# Patient Record
Sex: Female | Born: 1960 | Race: Black or African American | Hispanic: No | Marital: Married | Smoking: Never smoker
Health system: Southern US, Community
[De-identification: ages and names within clinical notes are randomized; demographics above are authoritative.]

## PROBLEM LIST (undated history)

## (undated) DIAGNOSIS — E119 Type 2 diabetes mellitus without complications: Secondary | ICD-10-CM

## (undated) DIAGNOSIS — I10 Essential (primary) hypertension: Secondary | ICD-10-CM

## (undated) DIAGNOSIS — D649 Anemia, unspecified: Secondary | ICD-10-CM

## (undated) DIAGNOSIS — E785 Hyperlipidemia, unspecified: Secondary | ICD-10-CM

## (undated) HISTORY — DX: Essential (primary) hypertension: I10

## (undated) HISTORY — DX: Type 2 diabetes mellitus without complications: E11.9

## (undated) HISTORY — DX: Anemia, unspecified: D64.9

## (undated) HISTORY — DX: Hyperlipidemia, unspecified: E78.5

## (undated) HISTORY — PX: APPENDECTOMY: SHX54

---

## 1998-11-07 ENCOUNTER — Other Ambulatory Visit: Admission: RE | Admit: 1998-11-07 | Discharge: 1998-11-07 | Payer: Self-pay | Admitting: Gynecology

## 2000-09-22 ENCOUNTER — Other Ambulatory Visit: Admission: RE | Admit: 2000-09-22 | Discharge: 2000-09-22 | Payer: Self-pay | Admitting: Gynecology

## 2001-08-11 ENCOUNTER — Other Ambulatory Visit: Admission: RE | Admit: 2001-08-11 | Discharge: 2001-08-11 | Payer: Self-pay | Admitting: Gynecology

## 2002-08-20 ENCOUNTER — Other Ambulatory Visit: Admission: RE | Admit: 2002-08-20 | Discharge: 2002-08-20 | Payer: Self-pay | Admitting: Gynecology

## 2003-10-04 ENCOUNTER — Other Ambulatory Visit: Admission: RE | Admit: 2003-10-04 | Discharge: 2003-10-04 | Payer: Self-pay | Admitting: Gynecology

## 2004-10-05 ENCOUNTER — Other Ambulatory Visit: Admission: RE | Admit: 2004-10-05 | Discharge: 2004-10-05 | Payer: Self-pay | Admitting: Gynecology

## 2005-10-14 ENCOUNTER — Other Ambulatory Visit: Admission: RE | Admit: 2005-10-14 | Discharge: 2005-10-14 | Payer: Self-pay | Admitting: Gynecology

## 2006-10-25 ENCOUNTER — Other Ambulatory Visit: Admission: RE | Admit: 2006-10-25 | Discharge: 2006-10-25 | Payer: Self-pay | Admitting: Gynecology

## 2007-11-09 ENCOUNTER — Other Ambulatory Visit: Admission: RE | Admit: 2007-11-09 | Discharge: 2007-11-09 | Payer: Self-pay | Admitting: Gynecology

## 2009-01-03 ENCOUNTER — Other Ambulatory Visit: Admission: RE | Admit: 2009-01-03 | Discharge: 2009-01-03 | Payer: Self-pay | Admitting: Gynecology

## 2009-01-03 ENCOUNTER — Ambulatory Visit: Payer: Self-pay | Admitting: Gynecology

## 2009-01-03 ENCOUNTER — Encounter: Payer: Self-pay | Admitting: Gynecology

## 2010-01-26 ENCOUNTER — Other Ambulatory Visit: Admission: RE | Admit: 2010-01-26 | Discharge: 2010-01-26 | Payer: Self-pay | Admitting: Gynecology

## 2010-01-26 ENCOUNTER — Ambulatory Visit: Payer: Self-pay | Admitting: Gynecology

## 2010-08-28 ENCOUNTER — Ambulatory Visit: Payer: Self-pay | Admitting: Gynecology

## 2011-01-29 ENCOUNTER — Encounter: Payer: Self-pay | Admitting: Gynecology

## 2011-01-29 ENCOUNTER — Ambulatory Visit (INDEPENDENT_AMBULATORY_CARE_PROVIDER_SITE_OTHER): Payer: PRIVATE HEALTH INSURANCE | Admitting: Gynecology

## 2011-01-29 ENCOUNTER — Other Ambulatory Visit (HOSPITAL_COMMUNITY)
Admission: RE | Admit: 2011-01-29 | Discharge: 2011-01-29 | Disposition: A | Discharge status: Home / Self Care | Payer: PRIVATE HEALTH INSURANCE | Source: Ambulatory Visit | Attending: Gynecology | Admitting: Gynecology

## 2011-01-29 ENCOUNTER — Encounter: Payer: Self-pay | Admitting: Gastroenterology

## 2011-01-29 VITALS — BP 140/80 | Ht 62.0 in | Wt 194.0 lb

## 2011-01-29 DIAGNOSIS — D259 Leiomyoma of uterus, unspecified: Secondary | ICD-10-CM

## 2011-01-29 DIAGNOSIS — N390 Urinary tract infection, site not specified: Secondary | ICD-10-CM

## 2011-01-29 DIAGNOSIS — Z131 Encounter for screening for diabetes mellitus: Secondary | ICD-10-CM

## 2011-01-29 DIAGNOSIS — Z01419 Encounter for gynecological examination (general) (routine) without abnormal findings: Secondary | ICD-10-CM

## 2011-01-29 DIAGNOSIS — Z13220 Encounter for screening for lipoid disorders: Secondary | ICD-10-CM

## 2011-01-29 DIAGNOSIS — E119 Type 2 diabetes mellitus without complications: Secondary | ICD-10-CM

## 2011-01-29 DIAGNOSIS — R82998 Other abnormal findings in urine: Secondary | ICD-10-CM

## 2011-01-29 NOTE — Progress Notes (Signed)
Jocelyn Cole [birth year] [id number]        50 y.o.  for annual exam.  Is doing well having regular menses.  History of significant leiomyoma for which she is asymptomatic. Using condoms for contraception.  Past medical history,surgical history, medications, allergies, family history and social history were all reviewed and documented in the EPIC chart. ROS:  Was performed and pertinent positives and negatives are included in the history.  Exam: chaperone present Filed Vitals:   01/29/11 1026  BP: 140/80   General appearance  Normal Skin grossly normal Head/Neck normal with no cervical or supraclavicular adenopathy thyroid normal Lungs  clear 1. Cardiac RR, without RMG Abdominal  soft, nontender, without masses, organomegaly or hernia Breasts  examined lying and sitting without masses, retractions, discharge or axillary adenopathy. Pelvic  Ext/BUS/vagina  normal   Cervix  normal  Pap done  Uterus  16 weeks size irregular consistent with leiomyoma, midline, nontender   Adnexa  Without masses or tenderness  Somewhat limited exam to to bulk of uterus  Anus and perineum  normal   Rectovaginal  normal sphincter tone without palpated masses or tenderness.    Assessment/Plan:  50 y.o. female for annual exam.     1. Leiomyoma. Uterus is bulky as it has been on prior exams. She again denies symptoms such as pressure or menorrhagia. Recommend repeat ultrasound now just for baseline rule out adnexal disease. She has not had an ultrasound in several years. She will schedule this and followup with me afterwards. The issues of surgery for uterine size alone again was discussed with her and she is not interested in pursuing surgery at this point assuming that the ultrasound is reassuring. 2. Health maintenance. Self breast exams on a month basis discussed at first. She is due for mammography in November knows to schedule this. She has a family history of her father with colon cancer and I  recommended screening colonoscopy and she stated that in fact she is in the process of arranging this. We'll check baseline labs of CBC urinalysis glucose lipid profilemeds she will follow up for her ultrasound and we'll go from there.  Dara Lords MD, 11:59 AM 01/29/2011   a

## 2011-02-01 ENCOUNTER — Other Ambulatory Visit: Payer: Self-pay | Admitting: Gynecology

## 2011-02-01 MED ORDER — SULFAMETHOXAZOLE-TRIMETHOPRIM 800-160 MG PO TABS: 1.0000 | ORAL_TABLET | Freq: Two times a day (BID) | ORAL | Status: AC

## 2011-02-12 ENCOUNTER — Other Ambulatory Visit: Payer: PRIVATE HEALTH INSURANCE

## 2011-02-12 ENCOUNTER — Ambulatory Visit: Payer: PRIVATE HEALTH INSURANCE | Admitting: Gynecology

## 2011-02-16 ENCOUNTER — Ambulatory Visit (AMBULATORY_SURGERY_CENTER): Payer: PRIVATE HEALTH INSURANCE | Admitting: *Deleted

## 2011-02-16 ENCOUNTER — Encounter: Payer: Self-pay | Admitting: Gastroenterology

## 2011-02-16 DIAGNOSIS — Z800 Family history of malignant neoplasm of digestive organs: Secondary | ICD-10-CM

## 2011-02-16 DIAGNOSIS — Z1211 Encounter for screening for malignant neoplasm of colon: Secondary | ICD-10-CM

## 2011-02-16 MED ORDER — PEG-KCL-NACL-NASULF-NA ASC-C 100 G PO SOLR: 1.0000 | Freq: Once | ORAL | Status: DC

## 2011-02-16 NOTE — Progress Notes (Signed)
With prep instructions, pt told to hold Iron PO 5 days before procedure (02-25-11)

## 2011-02-26 ENCOUNTER — Encounter: Payer: Self-pay | Admitting: Gynecology

## 2011-02-26 ENCOUNTER — Other Ambulatory Visit: Payer: Self-pay | Admitting: Gynecology

## 2011-02-26 ENCOUNTER — Ambulatory Visit (INDEPENDENT_AMBULATORY_CARE_PROVIDER_SITE_OTHER): Payer: PRIVATE HEALTH INSURANCE | Admitting: Gynecology

## 2011-02-26 ENCOUNTER — Ambulatory Visit (INDEPENDENT_AMBULATORY_CARE_PROVIDER_SITE_OTHER): Payer: PRIVATE HEALTH INSURANCE

## 2011-02-26 VITALS — BP 130/88

## 2011-02-26 DIAGNOSIS — D259 Leiomyoma of uterus, unspecified: Secondary | ICD-10-CM

## 2011-02-26 NOTE — Progress Notes (Signed)
Patient also for ultrasound and follow up of her leiomyoma. Ultrasound shows multiple myomas right and left ovaries visualized with physiologic changes.  Reviewed this with the patient she is asymptomatic does not want intervention and wants to continue to monitor her myomas. Patient will follow up with me which is due for her next annual see her as needed

## 2011-03-02 ENCOUNTER — Ambulatory Visit (AMBULATORY_SURGERY_CENTER): Payer: PRIVATE HEALTH INSURANCE | Admitting: Gastroenterology

## 2011-03-02 ENCOUNTER — Encounter: Payer: Self-pay | Admitting: Gastroenterology

## 2011-03-02 DIAGNOSIS — Z800 Family history of malignant neoplasm of digestive organs: Secondary | ICD-10-CM

## 2011-03-02 DIAGNOSIS — Z1211 Encounter for screening for malignant neoplasm of colon: Secondary | ICD-10-CM

## 2011-03-02 HISTORY — PX: COLONOSCOPY: SHX174

## 2011-03-02 MED ORDER — SODIUM CHLORIDE 0.9 % IV SOLN: 500.0000 mL | INTRAVENOUS | Status: DC

## 2011-03-02 NOTE — Patient Instructions (Signed)
Read the handouts given to you by your recovery room nurse.   You need to increase your fiber in your diet due to diverticulosis and hemorrhoids. You may resume all of your routine medications today.    If you have any questions, please call [phone].  Thank-you for choosing [place] for your healthcare today.

## 2011-03-03 ENCOUNTER — Telehealth: Payer: Self-pay

## 2011-03-03 NOTE — Telephone Encounter (Signed)

## 2011-03-10 ENCOUNTER — Encounter: Payer: Self-pay | Admitting: Gynecology

## 2011-03-22 ENCOUNTER — Encounter: Payer: Self-pay | Admitting: Gynecology

## 2012-02-04 ENCOUNTER — Encounter: Payer: PRIVATE HEALTH INSURANCE | Admitting: Gynecology

## 2012-02-11 ENCOUNTER — Encounter: Payer: PRIVATE HEALTH INSURANCE | Admitting: Gynecology

## 2012-02-25 ENCOUNTER — Encounter: Payer: PRIVATE HEALTH INSURANCE | Admitting: Gynecology

## 2012-03-10 ENCOUNTER — Ambulatory Visit (INDEPENDENT_AMBULATORY_CARE_PROVIDER_SITE_OTHER): Payer: PRIVATE HEALTH INSURANCE | Admitting: Gynecology

## 2012-03-10 ENCOUNTER — Encounter: Payer: Self-pay | Admitting: Gynecology

## 2012-03-10 VITALS — BP 134/84 | Ht 61.0 in | Wt 196.0 lb

## 2012-03-10 DIAGNOSIS — Z01419 Encounter for gynecological examination (general) (routine) without abnormal findings: Secondary | ICD-10-CM

## 2012-03-10 DIAGNOSIS — D259 Leiomyoma of uterus, unspecified: Secondary | ICD-10-CM

## 2012-03-10 DIAGNOSIS — Z131 Encounter for screening for diabetes mellitus: Secondary | ICD-10-CM

## 2012-03-10 NOTE — Patient Instructions (Signed)
Follow up in one year for annual exam. Follow up sooner if periods become irregular or atypical or if you start to develop lower abdominal symptoms secondary to the fibroid tumors.

## 2012-03-10 NOTE — Progress Notes (Signed)
Jocelyn Cole Jun 15, 1960 [id number]        51 y.o.  G1P0101 for annual exam.    Past medical history,surgical history, medications, allergies, family history and social history were all reviewed and documented in the EPIC chart. ROS:  Was performed and pertinent positives and negatives are included in the history.  Exam: Kim assistant Filed Vitals:   03/10/12 1453  BP: 134/84  Height: 5\' 1"  (1.549 m)  Weight: 196 lb (88.905 kg)   General appearance  Normal Skin grossly normal Head/Neck normal with no cervical or supraclavicular adenopathy thyroid normal Lungs  clear Cardiac RR, without RMG Abdominal  soft, nontender, without masses, organomegaly or hernia Breasts  examined lying and sitting without masses, retractions, discharge or axillary adenopathy. Pelvic  Ext/BUS/vagina  normal   Cervix  normal   Uterus  Bulky 16 weeks size, consistent with leiomyoma. Nontender   Adnexa  Difficult to assess due to uterus    Anus and perineum  normal   Rectovaginal  normal sphincter tone without palpated masses or tenderness.    Assessment/Plan:  51 y.o. G37P0101 female for annual exam.   1. Leiomyoma.  16 weeks size. Consistent with past exam. Again reviewed the issues with her. She is asymptomatic as far as pressure/pain/urinary/bowel movement issues. Menses are regular not overly heavy. The issue about not being able to evaluate the adnexa and ovarian cancer screening discussed. Patient is comfortable with continued observation. Ultrasound last year we'll hold this year as exam is unchanged. 2. Age 51. Continues with regular menses. No hot flushes night sweats or other menopausal symptoms. We'll continue to monitor. If less frequent but regular menses we'll follow period of prolonged or atypical bleeding she knows to represent for evaluation. 3. Blood pressure 134/84. Reviewed with patient. She'll have it rechecked in a non- exam situation.  It remains elevated the need to see a primary  discussed. 4. Mammography. Patient has scheduled end of this month. Continue with annual mammography. SBE monthly reviewed. 5. Pap smear. No Pap smear done today.  Pap smear 01/2011 normal.  No history of abnormal Pap smears. Plan every 3-5 year screening. 6. Colonoscopy 2012 normal. Plan repeat in 5 years given father's history. 7. Health maintenance.  CBC glucose and urinalysis ordered.  Lipid profile normal 2012. Follow up one year, sooner as needed.    Dara Lords MD, 4:10 PM 03/10/2012

## 2012-03-11 LAB — CBC WITH DIFFERENTIAL/PLATELET
Basophils Absolute: 0 10*3/uL (ref 0.0–0.1)
HCT: 36.8 % (ref 36.0–46.0)
Hemoglobin: 12 g/dL (ref 12.0–15.0)
Lymphocytes Relative: 32 % (ref 12–46)
Lymphs Abs: 2 10*3/uL (ref 0.7–4.0)
Monocytes Absolute: 0.5 10*3/uL (ref 0.1–1.0)
Monocytes Relative: 8 % (ref 3–12)
Neutro Abs: 3.5 10*3/uL (ref 1.7–7.7)
RBC: 4.94 MIL/uL (ref 3.87–5.11)
WBC: 6.1 10*3/uL (ref 4.0–10.5)

## 2012-03-11 LAB — URINALYSIS W MICROSCOPIC + REFLEX CULTURE
Glucose, UA: NEGATIVE mg/dL
Leukocytes, UA: NEGATIVE
Protein, ur: NEGATIVE mg/dL
pH: 6 (ref 5.0–8.0)

## 2012-03-24 ENCOUNTER — Encounter: Payer: Self-pay | Admitting: Gynecology

## 2012-07-02 ENCOUNTER — Ambulatory Visit: Payer: No Typology Code available for payment source

## 2012-07-02 ENCOUNTER — Ambulatory Visit (INDEPENDENT_AMBULATORY_CARE_PROVIDER_SITE_OTHER): Payer: No Typology Code available for payment source | Admitting: Emergency Medicine

## 2012-07-02 VITALS — BP 147/85 | HR 72 | Temp 98.2°F | Resp 16 | Ht 61.5 in | Wt 202.6 lb

## 2012-07-02 DIAGNOSIS — R05 Cough: Secondary | ICD-10-CM

## 2012-07-02 DIAGNOSIS — K219 Gastro-esophageal reflux disease without esophagitis: Secondary | ICD-10-CM

## 2012-07-02 DIAGNOSIS — R059 Cough, unspecified: Secondary | ICD-10-CM

## 2012-07-02 MED ORDER — RANITIDINE HCL 150 MG PO TABS: 150.0000 mg | ORAL_TABLET | Freq: Two times a day (BID) | ORAL | Status: DC

## 2012-07-02 MED ORDER — BENZONATATE 100 MG PO CAPS: 100.0000 mg | ORAL_CAPSULE | Freq: Three times a day (TID) | ORAL | Status: DC | PRN

## 2012-07-02 MED ORDER — HYDROCODONE-HOMATROPINE 5-1.5 MG/5ML PO SYRP: 5.0000 mL | ORAL_SOLUTION | Freq: Three times a day (TID) | ORAL | Status: DC | PRN

## 2012-07-02 NOTE — Progress Notes (Signed)
  Subjective:    Patient ID: Jocelyn Cole, female    DOB: 1961-01-20, 52 y.o.   MRN: [id number]  HPI  Patient comes in today stating that she had the flu a few weeks ago and can not get rid of the cough. She has a heavy feeling in her chest. She was using Robitussin and it would relieve the cough. The cough is not productive, and is occasional. She states that it is worse in the morning with occasional difficulties breathing. She started with the flu symptoms seven weeks ago. She vomited last Sunday and thought it was returning. She denies any further GI upset.   She takes an iron pill every day for anemia. She denies chest pain, any working out actually helps her breath better. She is currently on her menses and no chance of pregnancy.     Review of Systems     Objective:   Physical Exam  TM Normal. Throat is normal. Few rhonchi left upper lobe.   [facility] reading (PRIMARY) by  Dr.Daub NAD      Assessment & Plan:   1. Hycodan to take at bedtime. 2. Tesslon Pearls to take during the day. 3. Acid Reflux medication Zantac 150 BID.   Follow up if symptoms worsen.

## 2012-07-02 NOTE — Patient Instructions (Addendum)
Cough, Adult  A cough is a reflex that helps clear your throat and airways. It can help heal the body or may be a reaction to an irritated airway. A cough may only last 2 or 3 weeks (acute) or may last more than 8 weeks (chronic).  CAUSES Acute cough:  Viral or bacterial infections. Chronic cough:  Infections.  Allergies.  Asthma.  Post-nasal drip.  Smoking.  Heartburn or acid reflux.  Some medicines.  Chronic lung problems (COPD).  Cancer. SYMPTOMS   Cough.  Fever.  Chest pain.  Increased breathing rate.  High-pitched whistling sound when breathing (wheezing).  Colored mucus that you cough up (sputum). TREATMENT   A bacterial cough may be treated with antibiotic medicine.  A viral cough must run its course and will not respond to antibiotics.  Your caregiver may recommend other treatments if you have a chronic cough. HOME CARE INSTRUCTIONS   Only take over-the-counter or prescription medicines for pain, discomfort, or fever as directed by your caregiver. Use cough suppressants only as directed by your caregiver.  Use a cold steam vaporizer or humidifier in your bedroom or home to help loosen secretions.  Sleep in a semi-upright position if your cough is worse at night.  Rest as needed.  Stop smoking if you smoke. SEEK IMMEDIATE MEDICAL CARE IF:   You have pus in your sputum.  Your cough starts to worsen.  You cannot control your cough with suppressants and are losing sleep.  You begin coughing up blood.  You have difficulty breathing.  You develop pain which is getting worse or is uncontrolled with medicine.  You have a fever. MAKE SURE YOU:   Understand these instructions.  Will watch your condition.  Will get help right away if you are not doing well or get worse. Document Released: 10/16/2010 Document Revised: 07/12/2011 Document Reviewed: 10/16/2010 [facility] Patient Information 2013 [place], [place]. Gastroesophageal Reflux Disease,  Adult Gastroesophageal reflux disease (GERD) happens when acid from your stomach flows up into the esophagus. When acid comes in contact with the esophagus, the acid causes soreness (inflammation) in the esophagus. Over time, GERD may create small holes (ulcers) in the lining of the esophagus. CAUSES   Increased body weight. This puts pressure on the stomach, making acid rise from the stomach into the esophagus.  Smoking. This increases acid production in the stomach.  Drinking alcohol. This causes decreased pressure in the lower esophageal sphincter (valve or ring of muscle between the esophagus and stomach), allowing acid from the stomach into the esophagus.  Late evening meals and a full stomach. This increases pressure and acid production in the stomach.  A malformed lower esophageal sphincter. Sometimes, no cause is found. SYMPTOMS   Burning pain in the lower part of the mid-chest behind the breastbone and in the mid-stomach area. This may occur twice a week or more often.  Trouble swallowing.  Sore throat.  Dry cough.  Asthma-like symptoms including chest tightness, shortness of breath, or wheezing. DIAGNOSIS  Your caregiver may be able to diagnose GERD based on your symptoms. In some cases, X-rays and other tests may be done to check for complications or to check the condition of your stomach and esophagus. TREATMENT  Your caregiver may recommend over-the-counter or prescription medicines to help decrease acid production. Ask your caregiver before starting or adding any new medicines.  HOME CARE INSTRUCTIONS   Change the factors that you can control. Ask your caregiver for guidance concerning weight loss, quitting smoking, and alcohol  consumption.  Avoid foods and drinks that make your symptoms worse, such as:  Caffeine or alcoholic drinks.  Chocolate.  Peppermint or mint flavorings.  Garlic and onions.  Spicy foods.  Citrus fruits, such as oranges, lemons, or  limes.  Tomato-based foods such as sauce, chili, salsa, and pizza.  Fried and fatty foods.  Avoid lying down for the 3 hours prior to your bedtime or prior to taking a nap.  Eat small, frequent meals instead of large meals.  Wear loose-fitting clothing. Do not wear anything tight around your waist that causes pressure on your stomach.  Raise the head of your bed 6 to 8 inches with wood blocks to help you sleep. Extra pillows will not help.  Only take over-the-counter or prescription medicines for pain, discomfort, or fever as directed by your caregiver.  Do not take aspirin, ibuprofen, or other nonsteroidal anti-inflammatory drugs (NSAIDs). SEEK IMMEDIATE MEDICAL CARE IF:   You have pain in your arms, neck, jaw, teeth, or back.  Your pain increases or changes in intensity or duration.  You develop nausea, vomiting, or sweating (diaphoresis).  You develop shortness of breath, or you faint.  Your vomit is green, yellow, black, or looks like coffee grounds or blood.  Your stool is red, bloody, or black. These symptoms could be signs of other problems, such as heart disease, gastric bleeding, or esophageal bleeding. MAKE SURE YOU:   Understand these instructions.  Will watch your condition.  Will get help right away if you are not doing well or get worse. Document Released: 01/27/2005 Document Revised: 07/12/2011 Document Reviewed: 11/06/2010 [facility] Patient Information 2013 [place], [place].

## 2013-03-12 ENCOUNTER — Ambulatory Visit (INDEPENDENT_AMBULATORY_CARE_PROVIDER_SITE_OTHER): Payer: No Typology Code available for payment source | Admitting: Gynecology

## 2013-03-12 ENCOUNTER — Encounter: Payer: Self-pay | Admitting: Gynecology

## 2013-03-12 VITALS — BP 122/78 | Ht 61.0 in | Wt 205.0 lb

## 2013-03-12 DIAGNOSIS — Z01419 Encounter for gynecological examination (general) (routine) without abnormal findings: Secondary | ICD-10-CM

## 2013-03-12 DIAGNOSIS — D251 Intramural leiomyoma of uterus: Secondary | ICD-10-CM

## 2013-03-12 DIAGNOSIS — N926 Irregular menstruation, unspecified: Secondary | ICD-10-CM

## 2013-03-12 LAB — COMPREHENSIVE METABOLIC PANEL
ALT: 22 U/L (ref 0–35)
AST: 20 U/L (ref 0–37)
Albumin: 4.1 g/dL (ref 3.5–5.2)
Alkaline Phosphatase: 91 U/L (ref 39–117)
Glucose, Bld: 112 mg/dL — ABNORMAL HIGH (ref 70–99)
Potassium: 3.8 mEq/L (ref 3.5–5.3)
Sodium: 139 mEq/L (ref 135–145)
Total Bilirubin: 0.4 mg/dL (ref 0.3–1.2)
Total Protein: 7.1 g/dL (ref 6.0–8.3)

## 2013-03-12 LAB — LIPID PANEL
LDL Cholesterol: 139 mg/dL — ABNORMAL HIGH (ref 0–99)
VLDL: 19 mg/dL (ref 0–40)

## 2013-03-12 NOTE — Patient Instructions (Signed)
Follow up for ultrasound as scheduled 

## 2013-03-12 NOTE — Progress Notes (Signed)
Jocelyn Cole Sep 20, 1960 [id number]        52 y.o.  G1P0101 for annual exam.  Several issues noted below.  Past medical history,surgical history, problem list, medications, allergies, family history and social history were all reviewed and documented in the EPIC chart.  ROS:  Performed and pertinent positives and negatives are included in the history, assessment and plan .  Exam: Kim assistant Filed Vitals:   03/12/13 1536  BP: 122/78  Height: 5\' 1"  (1.549 m)  Weight: 205 lb (92.987 kg)   General appearance  Normal Skin grossly normal Head/Neck normal with no cervical or supraclavicular adenopathy thyroid normal Lungs  clear Cardiac RR, without RMG Abdominal  soft, nontender, without organomegaly or hernia. Palpable uterus above the pubic symphysis. Breasts  examined lying and sitting without masses, retractions, discharge or axillary adenopathy. Pelvic  Ext/BUS/vagina  normal  Cervix  normal  Uterus  large bulky 16-18 week size. Nontender.  Adnexa  difficult to evaluate due to enlarged uterus. No tenderness or gross masses   Anus and perineum  normal   Rectovaginal  normal sphincter tone without palpated masses or tenderness.    Assessment/Plan:  52 y.o. G14P0101 female for annual exam.   1. Leiomyoma. Bulky 16-18 week size uterus. Consistent with past exams. Maybe a little bit larger. Not overly bothersome to the patient without significant bleeding, pain, dyspareunia. Patient prefers expectant management. I did recommend ultrasound now as it has been 2 years for adnexal surveillance rule out nonpalpable abnormalities and she agrees to schedule. 2. Irregular menses. Patient has been skipping menses this past year. Had one in May and then one in October. No intermenstrual bleeding. No significant hot flashes night sweats. Check TSH FSH. Keep menstrual calendar. As long as less frequent but regular when they occur then we'll monitor. Prolonged or atypical she knows to represent for  further evaluation. 3. Pap smear 2012. No Pap smear done today. No history of significant abnormal Pap smears. Plan repeat Pap smear next year a 3 year interval. 4. Mammography due end of this month and she knows to schedule this and agrees to do so. SBE monthly reviewed 5. Colonoscopy 2012. Repeat at their recommended interval. 6. Health maintenance. Baseline CBC comprehensive metabolic panel lipid profile urinalysis TSH FSH ordered. Followup for ultrasound as scheduled.  Note: This document was prepared with digital dictation and possible smart phrase technology. Any transcriptional errors that result from this process are unintentional.   Dara Lords MD, 4:06 PM 03/12/2013

## 2013-03-13 ENCOUNTER — Other Ambulatory Visit: Payer: Self-pay | Admitting: Gynecology

## 2013-03-13 DIAGNOSIS — E7800 Pure hypercholesterolemia, unspecified: Secondary | ICD-10-CM

## 2013-03-13 DIAGNOSIS — R739 Hyperglycemia, unspecified: Secondary | ICD-10-CM

## 2013-03-13 DIAGNOSIS — E559 Vitamin D deficiency, unspecified: Secondary | ICD-10-CM

## 2013-03-13 LAB — URINALYSIS W MICROSCOPIC + REFLEX CULTURE
Casts: NONE SEEN
Leukocytes, UA: NEGATIVE
Nitrite: NEGATIVE
Specific Gravity, Urine: 1.027 (ref 1.005–1.030)
Urobilinogen, UA: 0.2 mg/dL (ref 0.0–1.0)
pH: 6 (ref 5.0–8.0)

## 2013-03-13 LAB — CBC WITH DIFFERENTIAL/PLATELET
Basophils Relative: 0 % (ref 0–1)
Hemoglobin: 13 g/dL (ref 12.0–15.0)
MCHC: 33.9 g/dL (ref 30.0–36.0)
Monocytes Relative: 8 % (ref 3–12)
Neutro Abs: 3.1 10*3/uL (ref 1.7–7.7)
Neutrophils Relative %: 49 % (ref 43–77)
Platelets: 258 10*3/uL (ref 150–400)
RBC: 5.17 MIL/uL — ABNORMAL HIGH (ref 3.87–5.11)

## 2013-03-13 LAB — FOLLICLE STIMULATING HORMONE: FSH: 62.6 m[IU]/mL

## 2013-03-14 LAB — URINE CULTURE: Organism ID, Bacteria: NO GROWTH

## 2013-03-23 ENCOUNTER — Ambulatory Visit: Payer: No Typology Code available for payment source | Admitting: Gynecology

## 2013-03-23 ENCOUNTER — Other Ambulatory Visit: Payer: No Typology Code available for payment source

## 2013-04-02 ENCOUNTER — Encounter: Payer: Self-pay | Admitting: Gynecology

## 2013-04-13 ENCOUNTER — Encounter: Payer: Self-pay | Admitting: Gynecology

## 2013-04-13 ENCOUNTER — Ambulatory Visit (INDEPENDENT_AMBULATORY_CARE_PROVIDER_SITE_OTHER): Payer: No Typology Code available for payment source

## 2013-04-13 ENCOUNTER — Other Ambulatory Visit: Payer: No Typology Code available for payment source

## 2013-04-13 ENCOUNTER — Other Ambulatory Visit: Payer: Self-pay | Admitting: Gynecology

## 2013-04-13 ENCOUNTER — Ambulatory Visit (INDEPENDENT_AMBULATORY_CARE_PROVIDER_SITE_OTHER): Payer: No Typology Code available for payment source | Admitting: Gynecology

## 2013-04-13 DIAGNOSIS — E7800 Pure hypercholesterolemia, unspecified: Secondary | ICD-10-CM

## 2013-04-13 DIAGNOSIS — R739 Hyperglycemia, unspecified: Secondary | ICD-10-CM

## 2013-04-13 DIAGNOSIS — D649 Anemia, unspecified: Secondary | ICD-10-CM

## 2013-04-13 DIAGNOSIS — N915 Oligomenorrhea, unspecified: Secondary | ICD-10-CM

## 2013-04-13 DIAGNOSIS — N852 Hypertrophy of uterus: Secondary | ICD-10-CM

## 2013-04-13 DIAGNOSIS — D251 Intramural leiomyoma of uterus: Secondary | ICD-10-CM

## 2013-04-13 DIAGNOSIS — N83209 Unspecified ovarian cyst, unspecified side: Secondary | ICD-10-CM

## 2013-04-13 LAB — LIPID PANEL
Cholesterol: 198 mg/dL (ref 0–200)
Total CHOL/HDL Ratio: 3.4 Ratio
Triglycerides: 63 mg/dL (ref <150)
VLDL: 13 mg/dL (ref 0–40)

## 2013-04-13 NOTE — Patient Instructions (Signed)
Follow-up in 6 months for repeat ultrasound 

## 2013-04-13 NOTE — Progress Notes (Signed)
Patient presents for ultrasound. History of leiomyoma and questionable enlargement on physical exam. She is otherwise asymptomatic without tenderness pressure symptoms bleeding dysmenorrhea.  Ultrasound shows uterus enlarged with multiple myomas the largest measuring 51 mm. Endometrial echo 7.7 mm. right ovary normal. Left ovary with simple echo-free thin-walled avascular cyst 19 mm mean. Cul-de-sac negative.  Assessment and plan: Asymptomatic leiomyoma approximately 16 weeks size. In comparison to ultrasound 2012 uterus without significant changes. Options again reviewed to include interventional versus observational period patient is very reluctant to consider intervention as she is asymptomatic. I think this is reasonable given stability over time and she is 52 hopefully entering menopause soon which should shrink her uterus somewhat. After lengthy discussion as to the pros/cons, risks/benefits the patient prefers observation. I did ask her to repeat an ultrasound in 6 months to relook at her left ovary just to make sure that this small cyst resolves and also see how she's doing from a symptom/menstrual standpoint.

## 2013-04-14 LAB — GLUCOSE, FASTING: Glucose, Fasting: 127 mg/dL — ABNORMAL HIGH (ref 70–99)

## 2013-04-17 ENCOUNTER — Telehealth: Payer: Self-pay | Admitting: *Deleted

## 2013-04-17 NOTE — Telephone Encounter (Signed)
Message copied by Aura Camps on Tue Apr 17, 2013 10:15 AM ------      Message from: Keenan Bachelor      Created: Mon Apr 16, 2013  3:23 PM      Regarding: Endocrinology referral       fasting glucose 127/hemoglobin A1c 7.0 both of which are diagnostic for diabetes. Patient needs appointment with endocrinologist to follow and treat as needed. Recommend appointment with Dr. Talmage Nap group.                  Victorino Dike, I informed patient and she knows she will be hearing from you about appt.  Thanks!!!!!!!! ------

## 2013-04-17 NOTE — Telephone Encounter (Signed)
Notes faxed to Dr.Balan office they will fax me with time & date to inform pt.

## 2013-04-18 NOTE — Telephone Encounter (Signed)
Appointment on 05/29/13 @ 10:30 am pt informed.

## 2013-10-05 ENCOUNTER — Ambulatory Visit: Payer: No Typology Code available for payment source | Admitting: Gynecology

## 2013-10-05 ENCOUNTER — Other Ambulatory Visit: Payer: No Typology Code available for payment source

## 2013-10-10 ENCOUNTER — Ambulatory Visit: Payer: PRIVATE HEALTH INSURANCE | Admitting: *Deleted

## 2014-02-10 ENCOUNTER — Ambulatory Visit (INDEPENDENT_AMBULATORY_CARE_PROVIDER_SITE_OTHER): Payer: No Typology Code available for payment source | Admitting: Family Medicine

## 2014-02-10 VITALS — BP 128/88 | HR 95 | Temp 98.7°F | Resp 16 | Ht 61.5 in | Wt 203.6 lb

## 2014-02-10 DIAGNOSIS — L723 Sebaceous cyst: Secondary | ICD-10-CM

## 2014-02-10 DIAGNOSIS — B360 Pityriasis versicolor: Secondary | ICD-10-CM

## 2014-02-10 MED ORDER — DOXYCYCLINE HYCLATE 100 MG PO CAPS: 100.0000 mg | ORAL_CAPSULE | Freq: Two times a day (BID) | ORAL | Status: DC

## 2014-02-10 NOTE — Progress Notes (Signed)
Subjective:    Patient ID: Jocelyn Cole, female    DOB: 1961-03-04, 53 y.o.   MRN: [id number]  HPI Chief Complaint  Patient presents with  . Odor    noticed for 2 months--sometimes a sweet/fruit smell or a foul smell on her chest  . Discolor    on her left side of her chest near armpit   This chart was scribed for Delman Cheadle, MD by Thea Alken, ED Scribe. This patient was seen in room 1 and the patient's care was started at 8:53 AM.  HPI Comments: Jocelyn Cole is a 53 y.o. female who presents to the [facility] complaining of discoloration to left chest with an odor. Pt describes the odor as fruity but can also be foul or rotten at times. She reports associated intermittent tenderness to chest. Pt has not tried creams or ointments. She believed odor initially was caused from DM medication, metformin. Pt states she is doing well in medication. Pt lives with husband, who has also noticed smell on pt chest. Pt last had routine blood work last month.   Pt reports diaphoresis at night. She reports having to change her night gown during the night due to sweats. Pt states she believed to be premenopausal hot flashes. Pt denies hot flashes during the day. She reports an upcoming appointment with OBGYN. During that appointment she plans to have a mammogram.    Pt also has cyst to left axilla. She denies drainage to this area.   Past Medical History  Diagnosis Date  . Anemia     takes Iron PO daily  . 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 Diabetes mellitus without complication    Past Surgical History  Procedure Laterality Date  . Appendectomy  AGE 71  . Colonoscopy  03/02/2011    DR.    Prior to Admission medications   Medication Sig Start Date End Date Taking? Authorizing Provider  Cholecalciferol (VITAMIN D PO) Take by mouth.   Yes Historical Provider, MD  glipiZIDE-metformin (METAGLIP) 2.5-500 MG per tablet Take 1 tablet by mouth daily.   Yes Historical  Provider, MD  IRON PO Take by mouth.     Yes Historical Provider, MD   Review of Systems  Constitutional: Positive for diaphoresis. Negative for fever, chills, activity change, appetite change, fatigue and unexpected weight change.  Cardiovascular: Negative for leg swelling.  Endocrine:       Night sweats  Skin: Positive for color change and pallor. Negative for rash and wound.  Hematological: Negative for adenopathy. Does not bruise/bleed easily.  Psychiatric/Behavioral: Positive for sleep disturbance.      BP 128/88  Pulse 95  Temp(Src) 98.7 F (37.1 C) (Oral)  Resp 16  Ht 5' 1.5" (1.562 m)  Wt 203 lb 9.6 oz (92.352 kg)  BMI 37.85 kg/m2  SpO2 97%  LMP 02/10/2014 Objective:   Physical Exam  Nursing note and vitals reviewed. Constitutional: She is oriented to person, place, and time. She appears well-developed and well-nourished. No distress.  HENT:  Head: Normocephalic and atraumatic.  Eyes: Conjunctivae and EOM are normal.  Neck: Neck supple.  Cardiovascular: Normal rate.   Pulmonary/Chest: Effort normal.  Musculoskeletal: Normal range of motion.  Neurological: She is alert and oriented to person, place, and time.  Skin: Skin is warm and dry.  Hyperpigmentation poorly defined serpiginous U approximately 1cm x 10 cm Soft, non tender, non erythematous 1cm subcuntaenous cyst without drainage to  left axilla  Psychiatric: She has a normal mood and affect. Her behavior is normal.    Assessment & Plan:   The primary encounter diagnosis was Sebaceous cyst of left axilla. A diagnosis of Tinea versicolor was also pertinent to this visit.  1. Sebaceous cyst of left axilla   2. Tinea versicolor   will refer to dermatology as not completely sure of accuracy of diagnosis - pt can cancel referral if treatment is successful. Recommend selenium sulfide body wash x 2 wks to trx subtle spots of hyperpigmentation.   Unsure of where odor is coming from but as pt has chronic fluctuant  axillary cyst will try antibiotic treatment and warm compresses qid to see if this helps.  If it does not, recommend f/u w/ PCP.  Encouraged pt to discuss her nightsweats with her PCP and gynecologist as could be menopausal but would want to ensure all infection and cancer screening is UTD.  Meds ordered this encounter  Medications  . glipiZIDE-metformin (METAGLIP) 2.5-500 MG per tablet    Sig: Take 1 tablet by mouth daily.  Marland Kitchen doxycycline (VIBRAMYCIN) 100 MG capsule    Sig: Take 1 capsule (100 mg total) by mouth 2 (two) times daily.    Dispense:  20 capsule    Refill:  0     I personally performed the services described in this documentation, which was scribed in my presence. The recorded information has been reviewed and considered, and addended by me as needed.  Delman Cheadle, MD MPH

## 2014-02-10 NOTE — Patient Instructions (Signed)
Tinea Versicolor Tinea versicolor is a common yeast infection of the skin. This condition becomes known when the yeast on our skin starts to overgrow (yeast is a normal inhabitant on our skin). This condition is noticed as white or light brown patches on brown skin, and is more evident in the summer on tanned skin. These areas are slightly scaly if scratched. The light patches from the yeast become evident when the yeast creates "holes in your suntan". This is most often noticed in the summer. The patches are usually located on the chest, back, pubis, neck and body folds. However, it may occur on any area of body. Mild itching and inflammation (redness or soreness) may be present. DIAGNOSIS  The diagnosisof this is made clinically (by looking). Cultures from samples are usually not needed. Examination under the microscope may help. However, yeast is normally found on skin. The diagnosis still remains clinical. Examination under Wood's Ultraviolet Light can determine the extent of the infection. TREATMENT  This common infection is usually only of cosmetic (only a concern to your appearance). It is easily treated with dandruff shampoo used during showers or bathing. Vigorous scrubbing will eliminate the yeast over several days time. The light areas in your skin may remain for weeks or months after the infection is cured unless your skin is exposed to sunlight. The lighter or darker spots caused by the fungus that remain after complete treatment are not a sign of treatment failure; it will take a long time to resolve. Your caregiver may recommend a number of commercial preparations or medication by mouth if home care is not working. Recurrence is common and preventative medication may be necessary. This skin condition is not highly contagious. Special care is not needed to protect close friends and family members. Normal hygiene is usually enough. Follow up is required only if you develop complications (such as a  secondary infection from scratching), if recommended by your caregiver, or if no relief is obtained from the preparations used. Document Released: 04/16/2000 Document Revised: 07/12/2011 Document Reviewed: 05/29/2008 ExitCare Patient Information 2015 [employer]. This information is not intended to replace advice given to you by your health care provider. Make sure you discuss any questions you have with your health care provider.  

## 2014-03-09 ENCOUNTER — Ambulatory Visit (INDEPENDENT_AMBULATORY_CARE_PROVIDER_SITE_OTHER): Payer: No Typology Code available for payment source | Admitting: Family Medicine

## 2014-03-09 VITALS — BP 150/96 | HR 80 | Temp 98.5°F | Resp 16 | Ht 61.5 in | Wt 202.0 lb

## 2014-03-09 DIAGNOSIS — E559 Vitamin D deficiency, unspecified: Secondary | ICD-10-CM

## 2014-03-09 DIAGNOSIS — Z136 Encounter for screening for cardiovascular disorders: Secondary | ICD-10-CM

## 2014-03-09 DIAGNOSIS — E119 Type 2 diabetes mellitus without complications: Secondary | ICD-10-CM

## 2014-03-09 DIAGNOSIS — Z1383 Encounter for screening for respiratory disorder NEC: Secondary | ICD-10-CM

## 2014-03-09 DIAGNOSIS — D508 Other iron deficiency anemias: Secondary | ICD-10-CM

## 2014-03-09 DIAGNOSIS — Z0000 Encounter for general adult medical examination without abnormal findings: Secondary | ICD-10-CM

## 2014-03-09 DIAGNOSIS — Z1389 Encounter for screening for other disorder: Secondary | ICD-10-CM

## 2014-03-09 DIAGNOSIS — Z1329 Encounter for screening for other suspected endocrine disorder: Secondary | ICD-10-CM

## 2014-03-09 LAB — COMPREHENSIVE METABOLIC PANEL
ALBUMIN: 3.8 g/dL (ref 3.5–5.2)
ALK PHOS: 66 U/L (ref 39–117)
ALT: 10 U/L (ref 0–35)
AST: 15 U/L (ref 0–37)
BUN: 11 mg/dL (ref 6–23)
CO2: 28 mEq/L (ref 19–32)
Calcium: 9.3 mg/dL (ref 8.4–10.5)
Chloride: 104 mEq/L (ref 96–112)
Creat: 0.72 mg/dL (ref 0.50–1.10)
Glucose, Bld: 89 mg/dL (ref 70–99)
POTASSIUM: 4.3 meq/L (ref 3.5–5.3)
SODIUM: 139 meq/L (ref 135–145)
Total Bilirubin: 0.3 mg/dL (ref 0.2–1.2)
Total Protein: 7.1 g/dL (ref 6.0–8.3)

## 2014-03-09 LAB — CBC
HCT: 36.2 % (ref 36.0–46.0)
Hemoglobin: 11.8 g/dL — ABNORMAL LOW (ref 12.0–15.0)
MCH: 24.4 pg — ABNORMAL LOW (ref 26.0–34.0)
MCHC: 32.6 g/dL (ref 30.0–36.0)
MCV: 74.9 fL — ABNORMAL LOW (ref 78.0–100.0)
Platelets: 295 10*3/uL (ref 150–400)
RBC: 4.83 MIL/uL (ref 3.87–5.11)
RDW: 15.4 % (ref 11.5–15.5)
WBC: 5.5 10*3/uL (ref 4.0–10.5)

## 2014-03-09 LAB — LIPID PANEL
Cholesterol: 163 mg/dL (ref 0–200)
HDL: 56 mg/dL (ref >39)
LDL CALC: 97 mg/dL (ref 0–99)
Total CHOL/HDL Ratio: 2.9 Ratio
Triglycerides: 48 mg/dL (ref <150)
VLDL: 10 mg/dL (ref 0–40)

## 2014-03-09 LAB — TSH: TSH: 1.977 u[IU]/mL (ref 0.350–4.500)

## 2014-03-09 LAB — FERRITIN: Ferritin: 27 ng/mL (ref 10–291)

## 2014-03-09 NOTE — Patient Instructions (Signed)

## 2014-03-09 NOTE — Progress Notes (Signed)
Subjective:  This chart was scribed for Delman Cheadle, MD by Dellis Filbert, ED Scribe at [facility].The patient was seen in exam room 10 and the patient's care was started at 9:03 AM.   Patient ID: Jocelyn Cole, female    DOB: 03/03/1961, 53 y.o.   MRN: [id number]  Chief Complaint  Patient presents with  . Annual Exam    W/O PAP - Pt ate a piece of cinnamon candy     HPI HPI Comments: Jocelyn Cole is a 53 y.o. female with a history of DM and fibroids who presents to [facility] her for a physical exam. Pt states for her LNMP was in September and October but not this month. She has not had the menses 6 months prior Dr. Modena Morrow does her PAP smears and mammograms and she has an appointment with him next month.  Pt takes 2000 units/day of vitamin day, pt does not take calcium supplements.  Dr. Chalmers Cater recommended she stays away from diary because of her DM. She has a follow up with Dr. Domenic Moras in 6 months.   Pt has not gotten a flu shot or tetanus shot and declines both. Pt says she is not around children or elderly who have a weakened immune system.   Pt has no questions or medical concerns.  Past Medical History  Diagnosis Date  . Anemia     takes Iron PO daily  . 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 Diabetes mellitus without complication    Current Outpatient Prescriptions on File Prior to Visit  Medication Sig Dispense Refill  . Cholecalciferol (VITAMIN D PO) Take by mouth.    Marland Kitchen glipiZIDE-metformin (METAGLIP) 2.5-500 MG per tablet Take 1 tablet by mouth daily.    . IRON PO Take by mouth.       No current facility-administered medications on file prior to visit.   No Known Allergies  Past Surgical History  Procedure Laterality Date  . Appendectomy  AGE 44  . Colonoscopy  03/02/2011    DR.    Family History  Problem Relation Age of Onset  . Hypertension Mother   . Diabetes Mother   . Diabetes Father   . Heart disease Father   . Cancer Father    COLON CANCER-Stomach cancer  . Colon cancer Father   . Stomach cancer Father   . Hypertension Sister   . Heart failure Sister   . Hypertension Brother   . Diabetes Brother   . Hypertension Brother   . Hypertension Brother   . Diabetes Sister   . Hypertension Sister   . Hypertension Sister    History   Social History  . Marital Status: Married    Spouse Name: N/A    Number of Children: N/A  . Years of Education: N/A   Social History Main Topics  . Smoking status: Never Smoker   . Smokeless tobacco: Never Used  . Alcohol Use: No  . Drug Use: No  . Sexual Activity:    Partners: Male    Birth Control/ Protection: Condom   Other Topics Concern  . None   Social History Narrative    Review of Systems  All other systems reviewed and are negative.      Objective:   Physical Exam  Constitutional: She is oriented to person, place, and time. She appears well-developed and well-nourished. No distress.  HENT:  Head: Normocephalic and atraumatic.  Right Ear: Tympanic membrane  is erythematous.  Left Ear: Tympanic membrane is erythematous.  Mouth/Throat: No oropharyngeal exudate.  Both external TM are erythematous  Eyes: EOM are normal. Pupils are equal, round, and reactive to light.  Neck: Normal range of motion. Neck supple.  Cardiovascular: Normal rate, regular rhythm and normal heart sounds.  Exam reveals no gallop and no friction rub.   No murmur heard. Pulmonary/Chest: Effort normal.  Lungs sound clear.  Abdominal: Soft. Bowel sounds are normal. She exhibits no distension. There is no rebound and no guarding.  Mild tenderness over suprapubic area.  Musculoskeletal: Normal range of motion.  2+ pattellar DTRs  Lymphadenopathy:    She has no cervical adenopathy.  Neurological: She is alert and oriented to person, place, and time.  Skin: Skin is warm and dry.  Psychiatric: She has a normal mood and affect. Her behavior is normal.  Nursing note and vitals  reviewed.   BP 150/96 mmHg  Pulse 80  Temp(Src) 98.5 F (36.9 C) (Oral)  Resp 16  Ht 5' 1.5" (1.562 m)  Wt 202 lb (91.627 kg)  BMI 37.55 kg/m2  SpO2 100%  LMP 02/11/2014      Assessment & Plan:  Annual physical exam - Plan: Comprehensive metabolic panel, Lipid panel, Vit D  25 hydroxy (rtn osteoporosis monitoring), CBC - declines tdap and flu, has OV sched w/ Dr. Phineas Real for pap and mammogram  Other iron deficiency anemias - Plan: Comprehensive metabolic panel, Lipid panel, Vit D  25 hydroxy (rtn osteoporosis monitoring), CBC, Ferritin - on once daily iron supp  Vitamin D deficiency - Plan: Comprehensive metabolic panel, Lipid panel, Vit D  25 hydroxy (rtn osteoporosis monitoring), CBC - on 2000u vit D otc  Diabetes mellitus without complication - Plan: Comprehensive metabolic panel, Lipid panel, Vit D  25 hydroxy (rtn osteoporosis monitoring), CBC - followed by Dr. Chalmers Cater - have labs faxed to her office.  Screening for cardiovascular, respiratory, and genitourinary diseases - Plan: Comprehensive metabolic panel, Lipid panel, Vit D  25 hydroxy (rtn osteoporosis monitoring), CBC  Screening for thyroid disorder - Plan: TSH, Comprehensive metabolic panel, Lipid panel, Vit D  25 hydroxy (rtn osteoporosis monitoring), CBC    I personally performed the services described in this documentation, which was scribed in my presence. The recorded information has been reviewed and considered, and addended by me as needed.  Delman Cheadle, MD MPH

## 2014-03-11 LAB — VITAMIN D 25 HYDROXY (VIT D DEFICIENCY, FRACTURES): VIT D 25 HYDROXY: 36 ng/mL (ref 30–89)

## 2014-03-13 ENCOUNTER — Encounter: Payer: Self-pay | Admitting: Family Medicine

## 2014-04-09 ENCOUNTER — Encounter: Payer: Self-pay | Admitting: Gynecology

## 2014-04-12 ENCOUNTER — Ambulatory Visit (INDEPENDENT_AMBULATORY_CARE_PROVIDER_SITE_OTHER): Payer: No Typology Code available for payment source | Admitting: Gynecology

## 2014-04-12 ENCOUNTER — Encounter: Payer: Self-pay | Admitting: Gynecology

## 2014-04-12 ENCOUNTER — Other Ambulatory Visit (HOSPITAL_COMMUNITY)
Admission: RE | Admit: 2014-04-12 | Discharge: 2014-04-12 | Disposition: A | Discharge status: Home / Self Care | Payer: PRIVATE HEALTH INSURANCE | Source: Ambulatory Visit | Attending: Gynecology | Admitting: Gynecology

## 2014-04-12 VITALS — BP 134/80 | Ht 61.0 in | Wt 201.0 lb

## 2014-04-12 DIAGNOSIS — D259 Leiomyoma of uterus, unspecified: Secondary | ICD-10-CM

## 2014-04-12 DIAGNOSIS — Z01419 Encounter for gynecological examination (general) (routine) without abnormal findings: Secondary | ICD-10-CM | POA: Insufficient documentation

## 2014-04-12 DIAGNOSIS — Z1151 Encounter for screening for human papillomavirus (HPV): Secondary | ICD-10-CM | POA: Insufficient documentation

## 2014-04-12 NOTE — Patient Instructions (Signed)
Call when you are ready to proceed with hysterectomy. Stay on an iron pill every day.  You may obtain a copy of any labs that were done today by logging onto MyChart as outlined in the instructions provided with your AVS (after visit summary). The office will not call with normal lab results but certainly if there are any significant abnormalities then we will contact you.   Health Maintenance, Female A healthy lifestyle and preventative care can promote health and wellness.  Maintain regular health, dental, and eye exams.  Eat a healthy diet. Foods like vegetables, fruits, whole grains, low-fat dairy products, and lean protein foods contain the nutrients you need without too many calories. Decrease your intake of foods high in solid fats, added sugars, and salt. Get information about a proper diet from your caregiver, if necessary.  Regular physical exercise is one of the most important things you can do for your health. Most adults should get at least 150 minutes of moderate-intensity exercise (any activity that increases your heart rate and causes you to sweat) each week. In addition, most adults need muscle-strengthening exercises on 2 or more days a week.   Maintain a healthy weight. The body mass index (BMI) is a screening tool to identify possible weight problems. It provides an estimate of body fat based on height and weight. Your caregiver can help determine your BMI, and can help you achieve or maintain a healthy weight. For adults 20 years and older:  A BMI below 18.5 is considered underweight.  A BMI of 18.5 to 24.9 is normal.  A BMI of 25 to 29.9 is considered overweight.  A BMI of 30 and above is considered obese.  Maintain normal blood lipids and cholesterol by exercising and minimizing your intake of saturated fat. Eat a balanced diet with plenty of fruits and vegetables. Blood tests for lipids and cholesterol should begin at age 28 and be repeated every 5 years. If your lipid  or cholesterol levels are high, you are over 50, or you are a high risk for heart disease, you may need your cholesterol levels checked more frequently.Ongoing high lipid and cholesterol levels should be treated with medicines if diet and exercise are not effective.  If you smoke, find out from your caregiver how to quit. If you do not use tobacco, do not start.  Lung cancer screening is recommended for adults aged 78 80 years who are at high risk for developing lung cancer because of a history of smoking. Yearly low-dose computed tomography (CT) is recommended for people who have at least a 30-pack-year history of smoking and are a current smoker or have quit within the past 15 years. A pack year of smoking is smoking an average of 1 pack of cigarettes a day for 1 year (for example: 1 pack a day for 30 years or 2 packs a day for 15 years). Yearly screening should continue until the smoker has stopped smoking for at least 15 years. Yearly screening should also be stopped for people who develop a health problem that would prevent them from having lung cancer treatment.  If you are pregnant, do not drink alcohol. If you are breastfeeding, be very cautious about drinking alcohol. If you are not pregnant and choose to drink alcohol, do not exceed 1 drink per day. One drink is considered to be 12 ounces (355 mL) of beer, 5 ounces (148 mL) of wine, or 1.5 ounces (44 mL) of liquor.  Avoid use of street drugs.  Do not share needles with anyone. Ask for help if you need support or instructions about stopping the use of drugs.  High blood pressure causes heart disease and increases the risk of stroke. Blood pressure should be checked at least every 1 to 2 years. Ongoing high blood pressure should be treated with medicines, if weight loss and exercise are not effective.  If you are 55 to 53 years old, ask your caregiver if you should take aspirin to prevent strokes.  Diabetes screening involves taking a blood  sample to check your fasting blood sugar level. This should be done once every 3 years, after age 45, if you are within normal weight and without risk factors for diabetes. Testing should be considered at a younger age or be carried out more frequently if you are overweight and have at least 1 risk factor for diabetes.  Breast cancer screening is essential preventative care for women. You should practice "breast self-awareness." This means understanding the normal appearance and feel of your breasts and may include breast self-examination. Any changes detected, no matter how small, should be reported to a caregiver. Women in their 20s and 30s should have a clinical breast exam (CBE) by a caregiver as part of a regular health exam every 1 to 3 years. After age 40, women should have a CBE every year. Starting at age 40, women should consider having a mammogram (breast X-ray) every year. Women who have a family history of breast cancer should talk to their caregiver about genetic screening. Women at a high risk of breast cancer should talk to their caregiver about having an MRI and a mammogram every year.  Breast cancer gene (BRCA)-related cancer risk assessment is recommended for women who have family members with BRCA-related cancers. BRCA-related cancers include breast, ovarian, tubal, and peritoneal cancers. Having family members with these cancers may be associated with an increased risk for harmful changes (mutations) in the breast cancer genes BRCA1 and BRCA2. Results of the assessment will determine the need for genetic counseling and BRCA1 and BRCA2 testing.  The Pap test is a screening test for cervical cancer. Women should have a Pap test starting at age 21. Between ages 21 and 29, Pap tests should be repeated every 2 years. Beginning at age 30, you should have a Pap test every 3 years as long as the past 3 Pap tests have been normal. If you had a hysterectomy for a problem that was not cancer or a  condition that could lead to cancer, then you no longer need Pap tests. If you are between ages 65 and 70, and you have had normal Pap tests going back 10 years, you no longer need Pap tests. If you have had past treatment for cervical cancer or a condition that could lead to cancer, you need Pap tests and screening for cancer for at least 20 years after your treatment. If Pap tests have been discontinued, risk factors (such as a new sexual partner) need to be reassessed to determine if screening should be resumed. Some women have medical problems that increase the chance of getting cervical cancer. In these cases, your caregiver may recommend more frequent screening and Pap tests.  The human papillomavirus (HPV) test is an additional test that may be used for cervical cancer screening. The HPV test looks for the virus that can cause the cell changes on the cervix. The cells collected during the Pap test can be tested for HPV. The HPV test could be used   to screen women aged 30 years and older, and should be used in women of any age who have unclear Pap test results. After the age of 30, women should have HPV testing at the same frequency as a Pap test.  Colorectal cancer can be detected and often prevented. Most routine colorectal cancer screening begins at the age of 50 and continues through age 75. However, your caregiver may recommend screening at an earlier age if you have risk factors for colon cancer. On a yearly basis, your caregiver may provide home test kits to check for hidden blood in the stool. Use of a small camera at the end of a tube, to directly examine the colon (sigmoidoscopy or colonoscopy), can detect the earliest forms of colorectal cancer. Talk to your caregiver about this at age 50, when routine screening begins. Direct examination of the colon should be repeated every 5 to 10 years through age 75, unless early forms of pre-cancerous polyps or small growths are found.  Hepatitis C blood  testing is recommended for all people born from 1945 through 1965 and any individual with known risks for hepatitis C.  Practice safe sex. Use condoms and avoid high-risk sexual practices to reduce the spread of sexually transmitted infections (STIs). Sexually active women aged 25 and younger should be checked for Chlamydia, which is a common sexually transmitted infection. Older women with new or multiple partners should also be tested for Chlamydia. Testing for other STIs is recommended if you are sexually active and at increased risk.  Osteoporosis is a disease in which the bones lose minerals and strength with aging. This can result in serious bone fractures. The risk of osteoporosis can be identified using a bone density scan. Women ages 65 and over and women at risk for fractures or osteoporosis should discuss screening with their caregivers. Ask your caregiver whether you should be taking a calcium supplement or vitamin D to reduce the rate of osteoporosis.  Menopause can be associated with physical symptoms and risks. Hormone replacement therapy is available to decrease symptoms and risks. You should talk to your caregiver about whether hormone replacement therapy is right for you.  Use sunscreen. Apply sunscreen liberally and repeatedly throughout the day. You should seek shade when your shadow is shorter than you. Protect yourself by wearing long sleeves, pants, a wide-brimmed hat, and sunglasses year round, whenever you are outdoors.  Notify your caregiver of new moles or changes in moles, especially if there is a change in shape or color. Also notify your caregiver if a mole is larger than the size of a pencil eraser.  Stay current with your immunizations. Document Released: 11/02/2010 Document Revised: 08/14/2012 Document Reviewed: 11/02/2010 ExitCare Patient Information 2014 [employer].   

## 2014-04-12 NOTE — Progress Notes (Signed)
Jocelyn [facility] [birth year] [id number]        53 y.o.  G1P0101 for annual exam.  Several issues noted below.  Past medical history,surgical history, problem list, medications, allergies, family history and social history were all reviewed and documented as reviewed in the EPIC chart.  ROS:  12 system ROS performed with pertinent positives and negatives included in the history, assessment and plan.   Additional significant findings :  none   Exam: Kim Counsellor Vitals:   04/12/14 1412  BP: 134/80  Height: 5\' 1"  (1.549 m)  Weight: 201 lb (91.173 kg)   General appearance:  Normal affect, orientation and appearance. Skin: Grossly normal HEENT: Without gross lesions.  No cervical or supraclavicular adenopathy. Thyroid normal.  Lungs:  Clear without wheezing, rales or rhonchi Cardiac: RR, without RMG Abdominal:  Soft, nontender, without, guarding, rebound, organomegaly or hernia. 18 week size pelvic mass consistent with her leiomyoma Breasts:  Examined lying and sitting without masses, retractions, discharge or axillary adenopathy. Pelvic:  Ext/BUS/vagina normal  Cervix normal. Pap/HPV  Uterus 18 week size irregular consistent with her leiomyoma  Adnexa  Without masses or tenderness    Anus and perineum  Normal   Rectovaginal  Normal sphincter tone without palpated masses or tenderness.    Assessment/Plan:  53 y.o. G31P0101 female for annual exam with irregular menses, condom contraception.   1. Leiomyoma. Patient with long history of leiomyoma. Uterus approximately 18 weeks size. She does note some symptoms when she is in bed rolling over but overall is relatively asymptomatic. Menses have become more irregular where she has spaced out skips up to 7 months although has had a menses in October and November. Kaser elevated in the 60 range last year. I again reviewed the issues of enlarged uterus in the perimenopausal period. Limits pelvic assessment for ovarian disease. Options for  continued observation versus surgery to include TAH with or without BSO discussed. The issues of BSO  In the perimenopause discussed to include a more smooth transition through menopause versus risks of ovarian disease in the future to include ovarian cancer.  Most recent ultrasound was last year demonstrating multiple myomas. Patient is now leaning towards proceeding with hysterectomy. Hemoglobin is 11 and up recommended on daily iron supplement. Risks of transfusion associated with hysterectomy particularly with enlarged uterus and leiomyoma also discussed. Patient will call me when she is ready to proceed with surgery. 2. Pap smear 2012. Paps/HPV today. No history of significant abnormal Pap smears. Plan repeat Pap smear in 3-5 year interval assuming this Pap smear is normal per current screening guidelines. 3. Mammography 04/2014. Continue annual mammography. SPEP of the reviewed. 4. Colonoscopy 2012. Repeat at their recommended interval. 5. Health maintenance. No routine blood work done as this is all been done through her primary physician's office. Follow up when she decides to proceed with hysterectomy otherwise annually.     Anastasio Auerbach MD, 2:56 PM 04/12/2014

## 2014-04-12 NOTE — Addendum Note (Signed)
Addended by: Nelva Nay on: 04/12/2014 03:10 PM   Modules accepted: Orders, SmartSet

## 2014-04-16 LAB — CYTOLOGY - PAP

## 2014-05-30 ENCOUNTER — Encounter (INDEPENDENT_AMBULATORY_CARE_PROVIDER_SITE_OTHER): Payer: No Typology Code available for payment source | Admitting: Ophthalmology

## 2014-06-06 ENCOUNTER — Encounter (INDEPENDENT_AMBULATORY_CARE_PROVIDER_SITE_OTHER): Payer: No Typology Code available for payment source | Admitting: Ophthalmology

## 2014-06-06 DIAGNOSIS — H31003 Unspecified chorioretinal scars, bilateral: Secondary | ICD-10-CM

## 2014-06-06 DIAGNOSIS — H2513 Age-related nuclear cataract, bilateral: Secondary | ICD-10-CM

## 2014-06-06 DIAGNOSIS — H43813 Vitreous degeneration, bilateral: Secondary | ICD-10-CM

## 2014-06-09 ENCOUNTER — Ambulatory Visit (INDEPENDENT_AMBULATORY_CARE_PROVIDER_SITE_OTHER): Payer: No Typology Code available for payment source | Admitting: Physician Assistant

## 2014-06-09 VITALS — BP 155/90 | HR 87 | Temp 98.3°F | Resp 16 | Ht 61.5 in | Wt 205.0 lb

## 2014-06-09 DIAGNOSIS — H5450 Low vision, one eye, unspecified eye: Secondary | ICD-10-CM

## 2014-06-09 DIAGNOSIS — R030 Elevated blood-pressure reading, without diagnosis of hypertension: Secondary | ICD-10-CM

## 2014-06-09 DIAGNOSIS — IMO0001 Reserved for inherently not codable concepts without codable children: Secondary | ICD-10-CM

## 2014-06-09 LAB — POCT CBC
Granulocyte percent: 56.7 %G (ref 37–80)
HCT, POC: 41.1 % (ref 37.7–47.9)
Hemoglobin: 12.8 g/dL (ref 12.2–16.2)
Lymph, poc: 2.2 (ref 0.6–3.4)
MCH: 23.3 pg — AB (ref 27–31.2)
MCHC: 31.3 g/dL — AB (ref 31.8–35.4)
MCV: 74.6 fL — AB (ref 80–97)
MID (cbc): 0.4 (ref 0–0.9)
MPV: 7.8 fL (ref 0–99.8)
PLATELET COUNT, POC: 247 10*3/uL (ref 142–424)
POC GRANULOCYTE: 3.5 (ref 2–6.9)
POC LYMPH PERCENT: 36.4 %L (ref 10–50)
POC MID %: 6.9 %M (ref 0–12)
RBC: 5.51 M/uL — AB (ref 4.04–5.48)
RDW, POC: 16.3 %
WBC: 6.1 10*3/uL (ref 4.6–10.2)

## 2014-06-09 NOTE — Progress Notes (Signed)
[facility] [address] [place] [address] [phone]  Date:  06/09/2014   Name:  Jocelyn Cole   DOB:  05-Jul-1960   MRN:  [id number]  PCP:  Anastasio Auerbach, MD    Chief Complaint: lab work   History of Present Illness:  Jocelyn Cole is a 54 y.o. very pleasant female patient who presents with the following: Patient is here at the request of her ophthalmologist.  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  This has been "since she can remember".  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  Patient endorses that she walked barefoot outdoors and had multiple dogs.  [place].  She notes no change in vision at her left eye.  She uses corrective lenses.  She notes that she has had floaters in the right eye.  This is followed by her regular ophthalmologist.  This has since resolved.  She has no pain, headache, redness, or watering from either eye.   Patient Active Problem List   Diagnosis Date Noted  . Fibroid   . Diabetes mellitus     Past Medical History  Diagnosis Date  . Anemia     takes Iron PO daily  . 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 Diabetes mellitus without complication     Past Surgical History  Procedure Laterality Date  . Appendectomy  AGE 44  . Colonoscopy  03/02/2011    DR.     History  Substance Use Topics  . Smoking status: Never Smoker   . Smokeless tobacco: Never Used  . Alcohol Use: No    Family History  Problem Relation Age of Onset  . Hypertension Mother   . Diabetes Mother   . Diabetes Father   . Heart disease Father   . Cancer Father     COLON CANCER-Stomach cancer  . Colon cancer Father   . Stomach cancer Father   . Hypertension Sister   . Heart failure Sister   . Hypertension Brother   .  Diabetes Brother   . Hypertension Brother   . Hypertension Brother   . Diabetes Sister   . Hypertension Sister   . Hypertension Sister     No Known Allergies  Medication list has been reviewed and updated.  Current Outpatient Prescriptions on File Prior to Visit  Medication Sig Dispense Refill  . Cholecalciferol (VITAMIN D PO) Take by mouth.    Marland Kitchen glipiZIDE-metformin (METAGLIP) 2.5-500 MG per tablet Take 1 tablet by mouth daily.    . IRON PO Take by mouth.       No current facility-administered medications on file prior to visit.    Review of Systems: ROS unremarkable unless mentioned above.  Physical Examination: Filed Vitals:   06/09/14 0811  BP: 142/90  Pulse: 87  Temp: 98.3 F (36.8 C)  Resp: 16   Filed Vitals:   06/09/14 0811  Height: 5' 1.5" (1.562 m)  Weight: 205 lb (92.987 kg)   Body mass index is 38.11 kg/(m^2). Ideal Body Weight: Weight in (lb) to have BMI = 25: 134.2  Physical Exam  Constitutional: She is oriented to person, place, and time. She appears well-developed and well-nourished. No distress.  HENT:  Head: Normocephalic  and atraumatic.  Eyes: EOM are normal. Pupils are equal, round, and reactive to light. Right conjunctiva is not injected. Left conjunctiva is not injected.  Fundoscopic exam was done without abnormalities detected, though this is without dilation, and complicated without that assistance.  Able to see color, however distinct shape is lost at a 2 feet distance.  Peripheral vision has upper quadrant deficiency.    Cardiovascular: Normal rate, regular rhythm and normal heart sounds.  Exam reveals no gallop and no friction rub.   No murmur heard. Respiratory: Breath sounds normal. No respiratory distress.  Neurological: She is alert and oriented to person, place, and time. No cranial nerve deficit.  Skin: Skin is warm and dry. She is not diaphoretic.  Psychiatric: She has a normal mood and affect. Her behavior is normal.      Assessment and Plan: 54 year old female with PMH of diabetes is here today for lab work to address etiology of longstanding vision loss of left eye.   Low vision, one eye - Plan: POCT CBC, Toxoplasma gondii antibody, IgG, Toxocara antibodies, bld, Toxoplasma gondii antibody, IgM  I will fulfill the lab request from her eye specialist and send over results.   Elevated BP BP rechecked at 155/90.  Advised patient to check bp at home for 4 weeks and jot down results.  If over 140/90, she will report back to [facility] where BP can be addressed and additional anti-hypertensive should be established.     Ivar Drape, PA-C [facility] 2/8/20168:21 AM

## 2014-06-09 NOTE — Patient Instructions (Signed)
We will be in touch with your lab results within the next 10 days.  These results will be sent to your ophthalmologist.

## 2014-06-10 LAB — TOXOPLASMA GONDII ANTIBODY, IGG: Toxoplasma IgG Ratio: <3 IU/mL (ref <7.2)

## 2014-06-10 LAB — TOXOPLASMA GONDII ANTIBODY, IGM

## 2014-06-14 LAB — TOXOCARA ANTIBODIES, BLD

## 2014-09-04 ENCOUNTER — Ambulatory Visit (INDEPENDENT_AMBULATORY_CARE_PROVIDER_SITE_OTHER): Payer: No Typology Code available for payment source | Admitting: Gynecology

## 2014-09-04 ENCOUNTER — Encounter: Payer: Self-pay | Admitting: Gynecology

## 2014-09-04 VITALS — BP 132/80

## 2014-09-04 DIAGNOSIS — D251 Intramural leiomyoma of uterus: Secondary | ICD-10-CM

## 2014-09-04 NOTE — Patient Instructions (Signed)
Office will call you to schedule surgery.

## 2014-09-04 NOTE — Progress Notes (Addendum)
KIMARIE COOR 1960/08/21 [id number]        54 y.o.  G1P0101 Presents wanting to proceed with hysterectomy.  She is a long history of leiomyoma with irregular bleeding. Has not had a menses since late fall but does have occasional spotting. Uterus at last exam December 2015 was 18 weeks size.  Recent hemoglobin 12.8.  Ultrasound 04/2013 confirmed multiple myomatous with normal-appearing right and left ovaries.  Past medical history,surgical history, problem list, medications, allergies, family history and social history were all reviewed and documented in the EPIC chart.  Directed ROS with pertinent positives and negatives documented in the history of present illness/assessment and plan.  Exam: Kim assistant Filed Vitals:   09/04/14 1400  BP: 132/80   General appearance:  Normal Abdomen with uterus palpable below umbilicus. No other masses tenderness organomegaly. Bimanual with 18 week size uterus filling the pelvis.  Assessment/Plan:  54 y.o. G1P0101 with large uterus secondary to leiomyoma. Is now having pelvic pain and pressure. She is no longer bleeding significantly but the discomfort is getting worse. Patient wants to proceed with hysterectomy. I reviewed with her that I would need to do a TAH. Robotic possibility also discussed and I offered referral although the issues with this surgery include failed attempt with exploratory laparoscopy discussed. Patient has already thought about this and she wants to proceed with a TAH. I also discussed the ovarian conservation issue in a peri-menopausal woman and the options to keep both ovaries for transitioning through menopause versus removing them prevent complications in the future to include ovarian cancer discussed. Patient would like to proceed with bilateral salpingo-oophorectomy. We discussed possible HRT afterwards if needed and the risks to include stroke heart attack DVT and breast cancer. I reviewed in general was involved with the  procedure to include the expected intraoperative and postoperative courses as well as the recovery period. Risks to include infection, transfusion, incisional complications requiring opening and draining of incisions and closure by secondary intention, inadvertent injury to internal organs including bowel bladder ureters vessels and nerves necessitating major exploratory reparative surgeries and future reparative surgeries including ostomy formation by resection bladder repair ureteral damage repair was discussed with her. Lastly we discussed options for autologous blood donation. The benefits and drawbacks of this were reviewed and ultimately the patient decided against this accepting the possibility of transfusion if needed. Patient will go ahead and schedule this.    Anastasio Auerbach MD, 2:18 PM 09/04/2014

## 2014-09-05 ENCOUNTER — Telehealth: Payer: Self-pay

## 2014-09-05 NOTE — Telephone Encounter (Signed)
I called patient to discuss scheduling TAH, BSO per Dr. Loetta Rough order. We discussed her ins benefits and estimated financial responsibility to [facility] . Financial letter will be mailed. We discussed FMLA forms and signing a release form. She will be by tomorrow to pick up a letter for her job and will sign release forms then.  I did discuss fee for initial from packet with her.  We scheduled her for 10/22/14 and she scheduled pre op consult appt.

## 2014-09-25 DIAGNOSIS — Z0289 Encounter for other administrative examinations: Secondary | ICD-10-CM

## 2014-10-14 ENCOUNTER — Encounter: Payer: Self-pay | Admitting: Gynecology

## 2014-10-16 ENCOUNTER — Encounter: Payer: Self-pay | Admitting: Gynecology

## 2014-10-16 ENCOUNTER — Ambulatory Visit (INDEPENDENT_AMBULATORY_CARE_PROVIDER_SITE_OTHER): Payer: No Typology Code available for payment source | Admitting: Gynecology

## 2014-10-16 VITALS — BP 140/86

## 2014-10-16 DIAGNOSIS — D251 Intramural leiomyoma of uterus: Secondary | ICD-10-CM | POA: Diagnosis not present

## 2014-10-16 DIAGNOSIS — N938 Other specified abnormal uterine and vaginal bleeding: Secondary | ICD-10-CM | POA: Diagnosis not present

## 2014-10-16 DIAGNOSIS — R102 Pelvic and perineal pain: Secondary | ICD-10-CM

## 2014-10-16 NOTE — H&P (Signed)
ECE CUMBERLAND 16-May-1960 [id number]   History and Physical  Chief complaint: abdominopelvic pain, irregular bleeding, leiomyoma  History of present illness: 54 y.o. G1P0101 with long history of leiomyoma reaching a current 18 week size uterus. Patient no longer having regular menses but finds herself spotting on a weekly basis. [place] last year in the 48s. Ultrasound 04/2013 showing multiple myomas with normal-appearing ovaries.  Now with increasing abdominal discomfort, cramping and pressure symptoms. Pap smear/HPV negative 2015.  Patient admitted for TAH/BSO.  Past medical history,surgical history, medications, allergies, family history and social history were all reviewed and documented in the EPIC chart.  ROS:  Was performed and pertinent positives and negatives are included in the history of present illness.  Exam:  Kim assistant 10/16/2014 General: well developed, well nourished female, no acute distress HEENT: normal  Lungs: clear to auscultation without wheezing, rales or rhonchi  Cardiac: regular rate without rubs, murmurs or gallops  Abdomen: soft, with palpable mass below umbilicus. No tenderness, guarding, rebound, organomegaly  Pelvic: external bus vagina: normal   Cervix: grossly normal  Uterus: 18 week size midline filling the pelvis. Adnexa: difficult to assess due to the bulk of the uterus.     Assessment/Plan:  54 y.o. G1P0101 with 18 week size uterus secondary to leiomyoma and increasing symptoms attributable to this mostly pressure particularly when rolling over in bed and vague cramping. Currently transitioning through menopause with elevated FSH and no regular menses although spotting almost on a weekly basis.  Options for management to include continued expectant management versus uterine artery embolization or more definitive surgery such as myomectomy versus hysterectomy reviewed.  After discussions over the past several years the patient wants to proceed with  definitive hysterectomy. Options for trial of robotic approach with referral was also discussed and declined and the patient is admitted for total abdominal hysterectomy bilateral salpingo-oophorectomy. The ovarian conservation issue was discussed with her and the options to keep both ovaries during the menopausal transition also accepting the risk of ovarian disease in the future versus removing her ovaries and accepting the risk of hypoestrogenic symptoms the possibility of ERT. I discussed the risks of ERT to include the WHI study with increased risk of stroke heart attack DVT and breast cancer. The patient has decided that she wants both ovaries and fallopian tubes removed and would accept HRT if needed. Sexuality following hysterectomy was also reviewed and the risks of persistent orgasmic dysfunction and persistent dyspareunia was discussed understood and accepted. The absolute and irreversible sterility associated with hysterectomy was also discussed.  The expected intraoperative and postoperative courses as well as the recovery period were reviewed. The risks of infection, prolonged antibiotics, reoperation for abscess or hematoma formation was discussed. The risks of hemorrhage necessitating transfusion and the risks of transfusion reaction, hepatitis, HIV, mad cow disease and other unknown entities was also discussed. Incisional complications to include opening and draining of incisions and closure by secondary intention, dehiscence and long-term issues of keloid/cosmetics and hernia formation were reviewed. The risk of inadvertent injury to internal organs including bowel, bladder, ureters, vessels, nerves either immediately recognized or delay recognized necessitating major exploratory reparative surgeries and future reparative surgeries including bowel resection, ostomy formation, bladder repair, ureteral damage repair was discussed with her. The patient's questions were answered to her satisfaction and  she is ready to proceed with surgery.    Anastasio Auerbach MD, 3:46 PM 10/16/2014

## 2014-10-16 NOTE — Progress Notes (Signed)
Jocelyn Cole [birth year] [id number]   Preoperative consult  Chief complaint: abdominopelvic pain, irregular bleeding, leiomyoma  History of present illness: 54 y.o. G1P0101 with long history of leiomyoma reaching a current 18 week size uterus. Patient no longer having regular menses but finds herself spotting on a weekly basis. [place] last year in the 24s. Ultrasound 04/2013 showing multiple myomas with normal-appearing ovaries.  Now with increasing abdominal discomfort, cramping and pressure symptoms. Pap smear/HPV negative 2015.  Patient admitted for TAH/BSO.  Past medical history,surgical history, medications, allergies, family history and social history were all reviewed and documented in the EPIC chart.  ROS:  Was performed and pertinent positives and negatives are included in the history of present illness.  Exam:  Kim assistant General: well developed, well nourished female, no acute distress HEENT: normal  Lungs: clear to auscultation without wheezing, rales or rhonchi  Cardiac: regular rate without rubs, murmurs or gallops  Abdomen: soft, with palpable mass below umbilicus. No tenderness, guarding, rebound, organomegaly  Pelvic: external bus vagina: normal   Cervix: grossly normal  Uterus: 18 week size midline filling the pelvis. Adnexa: difficult to assess due to the bulk of the uterus.     Assessment/Plan:  54 y.o. G1P0101 with 18 week size uterus secondary to leiomyoma and increasing symptoms attributable to this mostly pressure particularly when rolling over in bed and vague cramping. Currently transitioning through menopause with elevated FSH and no regular menses although spotting almost on a weekly basis.  Options for management to include continued expectant management versus uterine artery embolization or more definitive surgery such as myomectomy versus hysterectomy reviewed.  After discussions over the past several years the patient wants to proceed with definitive  hysterectomy. Options for trial of robotic approach with referral was also discussed and declined and the patient is admitted for total abdominal hysterectomy bilateral salpingo-oophorectomy. The ovarian conservation issue was discussed with her and the options to keep both ovaries during the menopausal transition also accepting the risk of ovarian disease in the future versus removing her ovaries and accepting the risk of hypoestrogenic symptoms the possibility of ERT. I discussed the risks of ERT to include the WHI study with increased risk of stroke heart attack DVT and breast cancer. The patient has decided that she wants both ovaries and fallopian tubes removed and would accept HRT if needed. Sexuality following hysterectomy was also reviewed and the risks of persistent orgasmic dysfunction and persistent dyspareunia was discussed understood and accepted. The absolute and irreversible sterility associated with hysterectomy was also discussed.  The expected intraoperative and postoperative courses as well as the recovery period were reviewed. The risks of infection, prolonged antibiotics, reoperation for abscess or hematoma formation was discussed. The risks of hemorrhage necessitating transfusion and the risks of transfusion reaction, hepatitis, HIV, mad cow disease and other unknown entities was also discussed. Incisional complications to include opening and draining of incisions and closure by secondary intention, dehiscence and long-term issues of keloid/cosmetics and hernia formation were reviewed. The risk of inadvertent injury to internal organs including bowel, bladder, ureters, vessels, nerves either immediately recognized or delay recognized necessitating major exploratory reparative surgeries and future reparative surgeries including bowel resection, ostomy formation, bladder repair, ureteral damage repair was discussed with her. The patient's questions were answered to her satisfaction and she is  ready to proceed with surgery.     Anastasio Auerbach MD, 3:18 PM 10/16/2014

## 2014-10-16 NOTE — Patient Instructions (Signed)
Followup for surgery as scheduled. 

## 2014-10-18 ENCOUNTER — Other Ambulatory Visit: Payer: Self-pay

## 2014-10-18 ENCOUNTER — Encounter (HOSPITAL_COMMUNITY): Payer: Self-pay

## 2014-10-18 ENCOUNTER — Encounter (HOSPITAL_COMMUNITY)
Admission: RE | Admit: 2014-10-18 | Discharge: 2014-10-18 | Disposition: A | Discharge status: Home / Self Care | Payer: PRIVATE HEALTH INSURANCE | Source: Ambulatory Visit | Attending: Gynecology | Admitting: Gynecology

## 2014-10-18 DIAGNOSIS — R102 Pelvic and perineal pain: Secondary | ICD-10-CM | POA: Diagnosis not present

## 2014-10-18 DIAGNOSIS — Z01818 Encounter for other preprocedural examination: Secondary | ICD-10-CM | POA: Diagnosis not present

## 2014-10-18 DIAGNOSIS — D219 Benign neoplasm of connective and other soft tissue, unspecified: Secondary | ICD-10-CM | POA: Insufficient documentation

## 2014-10-18 LAB — COMPREHENSIVE METABOLIC PANEL
ALT: 14 U/L (ref 14–54)
AST: 18 U/L (ref 15–41)
Albumin: 3.9 g/dL (ref 3.5–5.0)
Alkaline Phosphatase: 79 U/L (ref 38–126)
Anion gap: 4 — ABNORMAL LOW (ref 5–15)
BUN: 11 mg/dL (ref 6–20)
CO2: 30 mmol/L (ref 22–32)
Calcium: 9.2 mg/dL (ref 8.9–10.3)
Chloride: 104 mmol/L (ref 101–111)
Creatinine, Ser: 0.75 mg/dL (ref 0.44–1.00)
GFR calc Af Amer: >60 mL/min (ref >60)
GFR calc non Af Amer: >60 mL/min (ref >60)
Glucose, Bld: 96 mg/dL (ref 65–99)
Potassium: 4.1 mmol/L (ref 3.5–5.1)
SODIUM: 138 mmol/L (ref 135–145)
Total Bilirubin: 0.5 mg/dL (ref 0.3–1.2)
Total Protein: 7.6 g/dL (ref 6.5–8.1)

## 2014-10-18 LAB — CBC
HCT: 39 % (ref 36.0–46.0)
HEMOGLOBIN: 13.5 g/dL (ref 12.0–15.0)
MCH: 24.6 pg — ABNORMAL LOW (ref 26.0–34.0)
MCHC: 34.6 g/dL (ref 30.0–36.0)
MCV: 71.2 fL — AB (ref 78.0–100.0)
Platelets: 238 10*3/uL (ref 150–400)
RBC: 5.48 MIL/uL — ABNORMAL HIGH (ref 3.87–5.11)
RDW: 15.2 % (ref 11.5–15.5)
WBC: 7 10*3/uL (ref 4.0–10.5)

## 2014-10-18 NOTE — Patient Instructions (Signed)
Your procedure is scheduled on:10/22/14  Enter through the Main Entrance at :9am Pick up desk phone and dial [phone] and inform us of your arrival.  Please call [phone] if you have any problems the morning of surgery.  Remember: Do not eat food  after midnight:Monday Water or black coffee are ok until:6am on Tuesday   You may brush your teeth the morning of surgery. HOLD METFORMIN THE NIGHT BEFORE SURGERY  DO NOT wear jewelry, eye make-up, lipstick,body lotion, or dark fingernail polish.  (Polished toes are ok) You may wear deodorant.  If you are to be admitted after surgery, leave suitcase in car until your room has been assigned. Patients discharged on the day of surgery will not be allowed to drive home. Wear loose fitting, comfortable clothes for your ride home.

## 2014-10-21 MED ORDER — DEXTROSE 5 % IV SOLN
2.0000 g | INTRAVENOUS | Status: AC
  Administered 2014-10-22: 2 g via INTRAVENOUS
  Filled 2014-10-21: qty 2

## 2014-10-22 ENCOUNTER — Inpatient Hospital Stay (HOSPITAL_COMMUNITY): Payer: PRIVATE HEALTH INSURANCE | Admitting: Anesthesiology

## 2014-10-22 ENCOUNTER — Ambulatory Visit (HOSPITAL_COMMUNITY)
Admission: RE | Admit: 2014-10-22 | Discharge: 2014-10-23 | Disposition: A | Discharge status: Home / Self Care | Payer: PRIVATE HEALTH INSURANCE | Source: Ambulatory Visit | Attending: Gynecology | Admitting: Gynecology

## 2014-10-22 ENCOUNTER — Encounter (HOSPITAL_COMMUNITY): Payer: Self-pay | Admitting: *Deleted

## 2014-10-22 ENCOUNTER — Encounter (HOSPITAL_COMMUNITY)
Admission: RE | Disposition: A | Discharge status: Home / Self Care | Payer: Self-pay | Source: Ambulatory Visit | Attending: Gynecology

## 2014-10-22 DIAGNOSIS — N925 Other specified irregular menstruation: Principal | ICD-10-CM | POA: Insufficient documentation

## 2014-10-22 DIAGNOSIS — D251 Intramural leiomyoma of uterus: Secondary | ICD-10-CM | POA: Insufficient documentation

## 2014-10-22 DIAGNOSIS — D259 Leiomyoma of uterus, unspecified: Secondary | ICD-10-CM

## 2014-10-22 DIAGNOSIS — D250 Submucous leiomyoma of uterus: Secondary | ICD-10-CM | POA: Diagnosis not present

## 2014-10-22 DIAGNOSIS — E119 Type 2 diabetes mellitus without complications: Secondary | ICD-10-CM | POA: Insufficient documentation

## 2014-10-22 DIAGNOSIS — N72 Inflammatory disease of cervix uteri: Secondary | ICD-10-CM | POA: Insufficient documentation

## 2014-10-22 DIAGNOSIS — D252 Subserosal leiomyoma of uterus: Secondary | ICD-10-CM | POA: Insufficient documentation

## 2014-10-22 DIAGNOSIS — N926 Irregular menstruation, unspecified: Secondary | ICD-10-CM | POA: Diagnosis present

## 2014-10-22 DIAGNOSIS — N736 Female pelvic peritoneal adhesions (postinfective): Secondary | ICD-10-CM | POA: Insufficient documentation

## 2014-10-22 DIAGNOSIS — R102 Pelvic and perineal pain: Secondary | ICD-10-CM

## 2014-10-22 HISTORY — PX: ABDOMINAL HYSTERECTOMY: SHX81

## 2014-10-22 HISTORY — PX: SALPINGOOPHORECTOMY: SHX82

## 2014-10-22 LAB — PREGNANCY, URINE: Preg Test, Ur: NEGATIVE

## 2014-10-22 LAB — GLUCOSE, CAPILLARY
Glucose-Capillary: 107 mg/dL — ABNORMAL HIGH (ref 65–99)
Glucose-Capillary: 147 mg/dL — ABNORMAL HIGH (ref 65–99)

## 2014-10-22 SURGERY — HYSTERECTOMY, ABDOMINAL
Anesthesia: General

## 2014-10-22 MED ORDER — PHENYLEPHRINE HCL 10 MG/ML IJ SOLN
INTRAMUSCULAR | Status: DC | PRN
  Administered 2014-10-22 (×6): 40 ug via INTRAVENOUS

## 2014-10-22 MED ORDER — SCOPOLAMINE 1 MG/3DAYS TD PT72
1.0000 | MEDICATED_PATCH | Freq: Once | TRANSDERMAL | Status: DC
  Administered 2014-10-22: 1.5 mg via TRANSDERMAL

## 2014-10-22 MED ORDER — ONDANSETRON HCL 4 MG/2ML IJ SOLN: 4.0000 mg | Freq: Four times a day (QID) | INTRAMUSCULAR | Status: DC | PRN

## 2014-10-22 MED ORDER — DIPHENHYDRAMINE HCL 50 MG/ML IJ SOLN: 12.5000 mg | Freq: Four times a day (QID) | INTRAMUSCULAR | Status: DC | PRN

## 2014-10-22 MED ORDER — ACETAMINOPHEN 160 MG/5ML PO SOLN
975.0000 mg | Freq: Once | ORAL | Status: AC
Start: 2014-10-22 — End: 2014-10-22
  Administered 2014-10-22: 975 mg via ORAL

## 2014-10-22 MED ORDER — NEOSTIGMINE METHYLSULFATE 10 MG/10ML IV SOLN
INTRAVENOUS | Status: AC
  Filled 2014-10-22: qty 1

## 2014-10-22 MED ORDER — SODIUM CHLORIDE 0.9 % IJ SOLN
INTRAMUSCULAR | Status: AC
  Filled 2014-10-22: qty 50

## 2014-10-22 MED ORDER — ONDANSETRON HCL 4 MG/2ML IJ SOLN
INTRAMUSCULAR | Status: AC
  Filled 2014-10-22: qty 2

## 2014-10-22 MED ORDER — MIDAZOLAM HCL 2 MG/2ML IJ SOLN
INTRAMUSCULAR | Status: DC | PRN
  Administered 2014-10-22: 2 mg via INTRAVENOUS

## 2014-10-22 MED ORDER — GLIPIZIDE-METFORMIN HCL 2.5-500 MG PO TABS: 1.0000 | ORAL_TABLET | Freq: Every day | ORAL | Status: DC

## 2014-10-22 MED ORDER — GLIPIZIDE 2.5 MG HALF TABLET
2.5000 mg | ORAL_TABLET | Freq: Every day | ORAL | Status: DC
  Administered 2014-10-23: 2.5 mg via ORAL
  Filled 2014-10-22 (×2): qty 1

## 2014-10-22 MED ORDER — LIDOCAINE HCL (CARDIAC) 20 MG/ML IV SOLN
INTRAVENOUS | Status: AC
  Filled 2014-10-22: qty 5

## 2014-10-22 MED ORDER — MORPHINE SULFATE 1 MG/ML IV SOLN
INTRAVENOUS | Status: DC
  Administered 2014-10-22: 22.5 mg via INTRAVENOUS
  Administered 2014-10-22 (×2): via INTRAVENOUS
  Administered 2014-10-22: 12 mL via INTRAVENOUS
  Administered 2014-10-23: 9 mg via INTRAVENOUS
  Filled 2014-10-22 (×2): qty 25

## 2014-10-22 MED ORDER — SODIUM CHLORIDE 0.9 % IV SOLN
INTRAVENOUS | Status: DC | PRN
  Administered 2014-10-22: 20 mL via INTRAMUSCULAR

## 2014-10-22 MED ORDER — ROCURONIUM BROMIDE 100 MG/10ML IV SOLN
INTRAVENOUS | Status: AC
  Filled 2014-10-22: qty 1

## 2014-10-22 MED ORDER — ACETAMINOPHEN 160 MG/5ML PO SOLN
ORAL | Status: AC
  Administered 2014-10-22: 975 mg via ORAL
  Filled 2014-10-22: qty 40.6

## 2014-10-22 MED ORDER — HYDROMORPHONE HCL 1 MG/ML IJ SOLN
INTRAMUSCULAR | Status: AC
  Filled 2014-10-22: qty 1

## 2014-10-22 MED ORDER — DEXAMETHASONE SODIUM PHOSPHATE 10 MG/ML IJ SOLN
INTRAMUSCULAR | Status: DC | PRN
  Administered 2014-10-22: 4 mg via INTRAVENOUS

## 2014-10-22 MED ORDER — GLYCOPYRROLATE 0.2 MG/ML IJ SOLN
INTRAMUSCULAR | Status: AC
  Filled 2014-10-22: qty 1

## 2014-10-22 MED ORDER — 0.9 % SODIUM CHLORIDE (POUR BTL) OPTIME
TOPICAL | Status: DC | PRN
  Administered 2014-10-22 (×2): 1000 mL

## 2014-10-22 MED ORDER — ROCURONIUM BROMIDE 100 MG/10ML IV SOLN
INTRAVENOUS | Status: DC | PRN
  Administered 2014-10-22 (×3): 10 mg via INTRAVENOUS
  Administered 2014-10-22: 40 mg via INTRAVENOUS

## 2014-10-22 MED ORDER — SODIUM CHLORIDE 0.9 % IV SOLN: 60.0000 mL | Freq: Once | INTRAVENOUS | Status: DC

## 2014-10-22 MED ORDER — FENTANYL CITRATE (PF) 250 MCG/5ML IJ SOLN
INTRAMUSCULAR | Status: AC
  Filled 2014-10-22: qty 5

## 2014-10-22 MED ORDER — SODIUM CHLORIDE 0.9 % IJ SOLN: 9.0000 mL | INTRAMUSCULAR | Status: DC | PRN

## 2014-10-22 MED ORDER — HYDROMORPHONE HCL 1 MG/ML IJ SOLN
INTRAMUSCULAR | Status: DC | PRN
  Administered 2014-10-22: 1 mg via INTRAVENOUS

## 2014-10-22 MED ORDER — PROPOFOL 10 MG/ML IV BOLUS
INTRAVENOUS | Status: AC
  Filled 2014-10-22: qty 40

## 2014-10-22 MED ORDER — GLYCOPYRROLATE 0.2 MG/ML IJ SOLN
INTRAMUSCULAR | Status: DC | PRN
  Administered 2014-10-22: .5 mg via INTRAVENOUS

## 2014-10-22 MED ORDER — HYDROMORPHONE HCL 1 MG/ML IJ SOLN: 0.2500 mg | INTRAMUSCULAR | Status: DC | PRN

## 2014-10-22 MED ORDER — PROPOFOL 10 MG/ML IV BOLUS
INTRAVENOUS | Status: DC | PRN
  Administered 2014-10-22: 200 mg via INTRAVENOUS

## 2014-10-22 MED ORDER — MIDAZOLAM HCL 2 MG/2ML IJ SOLN
INTRAMUSCULAR | Status: AC
  Filled 2014-10-22: qty 2

## 2014-10-22 MED ORDER — DIPHENHYDRAMINE HCL 12.5 MG/5ML PO ELIX
12.5000 mg | ORAL_SOLUTION | Freq: Four times a day (QID) | ORAL | Status: DC | PRN
Start: 2014-10-22 — End: 2014-10-23

## 2014-10-22 MED ORDER — BUPIVACAINE LIPOSOME 1.3 % IJ SUSP
20.0000 mL | Freq: Once | INTRAMUSCULAR | Status: DC
  Filled 2014-10-22: qty 20

## 2014-10-22 MED ORDER — METHYLENE BLUE 1 % INJ SOLN
INTRAMUSCULAR | Status: AC
  Filled 2014-10-22: qty 10

## 2014-10-22 MED ORDER — FENTANYL CITRATE (PF) 250 MCG/5ML IJ SOLN
INTRAMUSCULAR | Status: AC
Start: 2014-10-22 — End: 2014-10-22
  Filled 2014-10-22: qty 5

## 2014-10-22 MED ORDER — GLYCOPYRROLATE 0.2 MG/ML IJ SOLN
INTRAMUSCULAR | Status: AC
  Filled 2014-10-22: qty 3

## 2014-10-22 MED ORDER — KETOROLAC TROMETHAMINE 30 MG/ML IJ SOLN
INTRAMUSCULAR | Status: AC
  Filled 2014-10-22: qty 1

## 2014-10-22 MED ORDER — ONDANSETRON HCL 4 MG/2ML IJ SOLN
INTRAMUSCULAR | Status: DC | PRN
  Administered 2014-10-22: 4 mg via INTRAVENOUS

## 2014-10-22 MED ORDER — VASOPRESSIN 20 UNIT/ML IV SOLN
INTRAVENOUS | Status: AC
  Filled 2014-10-22: qty 1

## 2014-10-22 MED ORDER — DEXTROSE-NACL 5-0.9 % IV SOLN
INTRAVENOUS | Status: DC
  Administered 2014-10-22 – 2014-10-23 (×2): via INTRAVENOUS

## 2014-10-22 MED ORDER — SODIUM CHLORIDE 0.9 % IJ SOLN
INTRAMUSCULAR | Status: AC
  Filled 2014-10-22: qty 10

## 2014-10-22 MED ORDER — SCOPOLAMINE 1 MG/3DAYS TD PT72
MEDICATED_PATCH | TRANSDERMAL | Status: AC
  Administered 2014-10-22: 1.5 mg via TRANSDERMAL
  Filled 2014-10-22: qty 1

## 2014-10-22 MED ORDER — NEOSTIGMINE METHYLSULFATE 10 MG/10ML IV SOLN
INTRAVENOUS | Status: DC | PRN
  Administered 2014-10-22: 3 mg via INTRAVENOUS

## 2014-10-22 MED ORDER — LIDOCAINE HCL (CARDIAC) 20 MG/ML IV SOLN
INTRAVENOUS | Status: DC | PRN
  Administered 2014-10-22: 100 mg via INTRAVENOUS

## 2014-10-22 MED ORDER — LACTATED RINGERS IV SOLN
INTRAVENOUS | Status: DC
  Administered 2014-10-22 (×4): via INTRAVENOUS

## 2014-10-22 MED ORDER — KETOROLAC TROMETHAMINE 30 MG/ML IJ SOLN
30.0000 mg | Freq: Four times a day (QID) | INTRAMUSCULAR | Status: DC
  Administered 2014-10-22 – 2014-10-23 (×3): 30 mg via INTRAVENOUS
  Filled 2014-10-22 (×3): qty 1

## 2014-10-22 MED ORDER — FENTANYL CITRATE (PF) 100 MCG/2ML IJ SOLN
INTRAMUSCULAR | Status: DC | PRN
  Administered 2014-10-22: 50 ug via INTRAVENOUS
  Administered 2014-10-22: 100 ug via INTRAVENOUS
  Administered 2014-10-22 (×4): 50 ug via INTRAVENOUS

## 2014-10-22 MED ORDER — KETOROLAC TROMETHAMINE 30 MG/ML IJ SOLN
INTRAMUSCULAR | Status: DC | PRN
Start: 2014-10-22 — End: 2014-10-22
  Administered 2014-10-22: 30 mg via INTRAVENOUS

## 2014-10-22 MED ORDER — SODIUM CHLORIDE 0.9 % IV SOLN
INTRAVENOUS | Status: DC | PRN
  Administered 2014-10-22: 40 mL

## 2014-10-22 MED ORDER — METFORMIN HCL 500 MG PO TABS
500.0000 mg | ORAL_TABLET | Freq: Every day | ORAL | Status: DC
  Administered 2014-10-23: 500 mg via ORAL
  Filled 2014-10-22 (×2): qty 1

## 2014-10-22 MED ORDER — METHYLENE BLUE 1 % INJ SOLN
INTRAMUSCULAR | Status: DC | PRN
  Administered 2014-10-22: 9 mL via INTRAVENOUS

## 2014-10-22 MED ORDER — NALOXONE HCL 0.4 MG/ML IJ SOLN: 0.4000 mg | INTRAMUSCULAR | Status: DC | PRN

## 2014-10-22 MED ORDER — DEXAMETHASONE SODIUM PHOSPHATE 10 MG/ML IJ SOLN
INTRAMUSCULAR | Status: AC
  Filled 2014-10-22: qty 1

## 2014-10-22 MED ORDER — KETOROLAC TROMETHAMINE 30 MG/ML IJ SOLN: 30.0000 mg | Freq: Four times a day (QID) | INTRAMUSCULAR | Status: DC

## 2014-10-22 SURGICAL SUPPLY — 47 items
APL SKNCLS STERI-STRIP NONHPOA (GAUZE/BANDAGES/DRESSINGS) ×2
BARRIER ADHS 3X4 INTERCEED (GAUZE/BANDAGES/DRESSINGS) IMPLANT
BENZOIN TINCTURE PRP APPL 2/3 (GAUZE/BANDAGES/DRESSINGS) ×2 IMPLANT
BRR ADH 4X3 ABS CNTRL BYND (GAUZE/BANDAGES/DRESSINGS)
CANISTER SUCT 3000ML (MISCELLANEOUS) ×4 IMPLANT
CLOSURE WOUND 1/2 X4 (GAUZE/BANDAGES/DRESSINGS) ×1
CLOTH BEACON ORANGE TIMEOUT ST (SAFETY) ×4 IMPLANT
CONT PATH 16OZ SNAP LID 3702 (MISCELLANEOUS) ×4 IMPLANT
CONT SPECI 4OZ STER CLIK (MISCELLANEOUS) ×4 IMPLANT
DECANTER SPIKE VIAL GLASS SM (MISCELLANEOUS) IMPLANT
DRAPE CESAREAN BIRTH W POUCH (DRAPES) ×4 IMPLANT
DRAPE WARM FLUID 44X44 (DRAPE) IMPLANT
DRSG OPSITE POSTOP 4X10 (GAUZE/BANDAGES/DRESSINGS) ×4 IMPLANT
DURAPREP 26ML APPLICATOR (WOUND CARE) ×4 IMPLANT
GLOVE BIO SURGEON STRL SZ7.5 (GLOVE) ×4 IMPLANT
GLOVE BIOGEL PI IND STRL 7.0 (GLOVE) ×4 IMPLANT
GLOVE BIOGEL PI IND STRL 7.5 (GLOVE) ×2 IMPLANT
GLOVE BIOGEL PI IND STRL 8 (GLOVE) ×2 IMPLANT
GLOVE BIOGEL PI INDICATOR 7.0 (GLOVE) ×4
GLOVE BIOGEL PI INDICATOR 7.5 (GLOVE) ×2
GLOVE BIOGEL PI INDICATOR 8 (GLOVE) ×2
GLOVE ECLIPSE 7.5 STRL STRAW (GLOVE) ×4 IMPLANT
GOWN STRL REUS W/TWL LRG LVL3 (GOWN DISPOSABLE) ×8 IMPLANT
NEEDLE HYPO 22GX1.5 SAFETY (NEEDLE) ×4 IMPLANT
NS IRRIG 1000ML POUR BTL (IV SOLUTION) ×4 IMPLANT
PACK ABDOMINAL GYN (CUSTOM PROCEDURE TRAY) ×4 IMPLANT
PAD OB MATERNITY 4.3X12.25 (PERSONAL CARE ITEMS) ×4 IMPLANT
PROTECTOR NERVE ULNAR (MISCELLANEOUS) ×4 IMPLANT
SPONGE LAP 18X18 X RAY DECT (DISPOSABLE) ×12 IMPLANT
STAPLER VISISTAT 35W (STAPLE) ×4 IMPLANT
STRIP CLOSURE SKIN 1/2X4 (GAUZE/BANDAGES/DRESSINGS) ×1 IMPLANT
SUT CHROMIC 3 0 TIES (SUTURE) IMPLANT
SUT SILK 3 0 SH 30 (SUTURE) IMPLANT
SUT VIC AB 0 CT1 18XCR BRD8 (SUTURE) ×6 IMPLANT
SUT VIC AB 0 CT1 27 (SUTURE) ×12
SUT VIC AB 0 CT1 27XBRD ANBCTR (SUTURE) ×6 IMPLANT
SUT VIC AB 0 CT1 8-18 (SUTURE) ×16
SUT VIC AB 2-0 CT1 27 (SUTURE)
SUT VIC AB 2-0 CT1 TAPERPNT 27 (SUTURE) IMPLANT
SUT VIC AB 2-0 SH 27 (SUTURE)
SUT VIC AB 2-0 SH 27XBRD (SUTURE) IMPLANT
SUT VIC AB 4-0 KS 27 (SUTURE) IMPLANT
SUT VICRYL 0 TIES 12 18 (SUTURE) ×4 IMPLANT
SYR CONTROL 10ML LL (SYRINGE) ×4 IMPLANT
TOWEL OR 17X24 6PK STRL BLUE (TOWEL DISPOSABLE) ×8 IMPLANT
TRAY FOLEY CATH SILVER 14FR (SET/KITS/TRAYS/PACK) ×4 IMPLANT
WATER STERILE IRR 1000ML POUR (IV SOLUTION) ×4 IMPLANT

## 2014-10-22 NOTE — OR Nursing (Signed)
Skin closed at 1310.  Waiting on cystoscopy instruments to do cysto before leaving room.

## 2014-10-22 NOTE — Op Note (Signed)
Jocelyn Cole Sep 03, 1960 [id number]   Day of Surgery s/p Procedure(s): HYSTERECTOMY ABDOMINAL, CYSTOSCOPY SALPINGO OOPHORECTOMY  Subjective: Patient reports no acute distress, pain severity reported mild, Yes.   taking PO, foley catheter in place, No. ambulating, No. passing flatus  Objective: Vital signs in last 24 hours: Temp:  [98.1 F (36.7 C)-98.7 F (37.1 C)] 98.3 F (36.8 C) (06/21 1700) Pulse Rate:  [62-73] 73 (06/21 1700) Resp:  [10-20] 17 (06/21 1700) BP: (119-161)/(70-93) 129/79 mmHg (06/21 1700) SpO2:  [100 %] 100 % (06/21 1700) Weight:  [200 lb (90.719 kg)] 200 lb (90.719 kg) (06/21 1610)    EXAM General: awake, alert and no distress Resp: clear to auscultation bilaterally Cardio: regular rate and rhythm GI: soft, mild tenderness, bowel sounds present, dressing dry Lower Extremities: Without swelling or tenderness Vaginal Bleeding: scant  Lab Results:  No results for input(s): WBC, HGB, HCT, PLT in the last 72 hours.  Assessment: s/p Procedure(s): HYSTERECTOMY ABDOMINAL, CYSTOSCOPY SALPINGO OOPHORECTOMY: stable.  Plan: Continue routine post operative care.  Results of surgery reviewed with patient to include increased blood loss.  Will check Hb in am.   LOS: 0 days    Anastasio Auerbach MD, 6:01 PM 10/22/2014

## 2014-10-22 NOTE — H&P (Signed)
  The patient was examined.  I reviewed the proposed surgery and consent form with the patient.  The dictated history and physical is current and accurate and all questions were answered. The patient is ready to proceed with surgery and has a realistic understanding and expectation for the outcome.   Anastasio Auerbach MD, 10:06 AM 10/22/2014

## 2014-10-22 NOTE — Transfer of Care (Signed)
Immediate Anesthesia Transfer of Care Note  Patient: Jocelyn Cole  Procedure(s) Performed: Procedure(s) with comments: HYSTERECTOMY ABDOMINAL, CYSTOSCOPY (N/A) - request to follow 7:30am case.  Requested 2 hours OR time for case SALPINGO OOPHORECTOMY (Bilateral)  Patient Location: PACU  Anesthesia Type:General  Level of Consciousness: awake, patient cooperative and responds to stimulation  Airway & Oxygen Therapy: Patient Spontanous Breathing and Patient connected to nasal cannula oxygen  Post-op Assessment: Report given to RN and Post -op Vital signs reviewed and stable  Post vital signs: Reviewed and stable  Last Vitals:  Filed Vitals:   10/22/14 0937  BP: 150/91  Pulse:   Temp:     Complications: No apparent anesthesia complications

## 2014-10-22 NOTE — Anesthesia Preprocedure Evaluation (Signed)
Anesthesia Evaluation  Patient identified by MRN, date of birth, ID band Patient awake    Reviewed: Allergy & Precautions, H&P , Patient's Chart, lab work & pertinent test results, reviewed documented beta blocker date and time   Airway Mallampati: II  TM Distance: >3 FB Neck ROM: full    Dental no notable dental hx.    Pulmonary  breath sounds clear to auscultation  Pulmonary exam normal       Cardiovascular Rhythm:regular Rate:Normal     Neuro/Psych    GI/Hepatic   Endo/Other  diabetes, Type 2  Renal/GU      Musculoskeletal   Abdominal   Peds  Hematology   Anesthesia Other Findings   Reproductive/Obstetrics                             Anesthesia Physical Anesthesia Plan  ASA: III  Anesthesia Plan: General   Post-op Pain Management:    Induction: Intravenous  Airway Management Planned: Oral ETT  Additional Equipment:   Intra-op Plan:   Post-operative Plan: Extubation in OR  Informed Consent: I have reviewed the patients History and Physical, chart, labs and discussed the procedure including the risks, benefits and alternatives for the proposed anesthesia with the patient or authorized representative who has indicated his/her understanding and acceptance.   Dental Advisory Given and Dental advisory given  Plan Discussed with: CRNA and Surgeon  Anesthesia Plan Comments: (  Discussed general anesthesia, including possible nausea, instrumentation of airway, sore throat,pulmonary aspiration, etc. I asked if the were any outstanding questions, or  concerns before we proceeded. )        Anesthesia Quick Evaluation

## 2014-10-22 NOTE — Anesthesia Procedure Notes (Signed)
Procedure Name: Intubation Date/Time: 10/22/2014 10:46 AM Performed by: Georgeanne Nim Pre-anesthesia Checklist: Patient identified, Emergency Drugs available, Suction available, Patient being monitored and Timeout performed Patient Re-evaluated:Patient Re-evaluated prior to inductionOxygen Delivery Method: Circle system utilized Preoxygenation: Pre-oxygenation with 100% oxygen Intubation Type: IV induction Ventilation: Mask ventilation without difficulty and Oral airway inserted - appropriate to patient size Tube type: Oral Tube size: 7.0 mm Airway Equipment and Method: Lighted stylet (surch-lite used) Placement Confirmation: positive ETCO2,  CO2 detector and breath sounds checked- equal and bilateral Secured at: 20 cm Tube secured with: Tape Dental Injury: Teeth and Oropharynx as per pre-operative assessment

## 2014-10-22 NOTE — Anesthesia Postprocedure Evaluation (Signed)
Anesthesia Post Note  Patient: Jocelyn Cole  Procedure(s) Performed: Procedure(s) (LRB): HYSTERECTOMY ABDOMINAL, CYSTOSCOPY (N/A) SALPINGO OOPHORECTOMY (Bilateral)  Anesthesia type: General  Patient location: PACU  Post pain: Pain level controlled  Post assessment: Post-op Vital signs reviewed  Last Vitals:  Filed Vitals:   10/22/14 1530  BP: 124/72  Pulse: 69  Temp:   Resp: 20    Post vital signs: Reviewed  Level of consciousness: sedated  Complications: No apparent anesthesia complications

## 2014-10-22 NOTE — Op Note (Signed)
Jocelyn Cole 1961/01/24 [id number]   Post Operative Note   Date of surgery:  10/22/2014  Pre Op Dx:  Abdominopelvic pain, irregular bleeding, leiomyoma  Post Op Dx:  Abdominopelvic pain, abdominopelvic adhesions, irregular bleeding, leiomyoma, endometriosis  Procedure:  Total abdominal hysterectomy, bilateral salpingo-oophorectomy, lysis of adhesions, cystoscopy  Surgeon:  Anastasio Auerbach  Assistant:  Uvaldo Rising  Anesthesia:  General  EBL:  182 cc  Complications:  None  Specimen:  Uterus, bilateral fallopian tubes, bilateral ovaries, total clinical weight 585 grams to pathology  Findings: EUA:  External BUS vagina normal. Cervix grossly normal. Uterus 16 week size midline.   Operative:  Omental to anterior abdominal wall adhesions, lysed.  Anterior cul-de-sac normal. Posterior cul-de-sac obliterated with filmy adhesions, lysed. Uterus grossly enlarged with multiple leiomyoma ranging from subserosal, intramural and submucosal. Right and left ovaries adherent in the cul-de-sac to the posterior lower uterine segment along with attached fallopian tubes. Left fallopian tube with 2 classic powder burn endometriotic implants. No other gross evidence of endometriosis within the pelvis. Upper abdominal exam palpably normal. Appendix not visualized.  Procedure:  The patient was taken to the operating room, underwent general anesthesia, received a vaginal/perineal preparation with Betadine solution and a Foley catheter was placed in sterile technique by nursing personnel. She subsequently received an abdominal preparation with DuraPrep. The timeout was performed by the surgical team.  An EUA was performed and the patient was draped in the usual fashion. The abdomen was sharply entered through a Pfannenstiel incision achieving adequate hemostasis at all levels. Several adhesive bands involving the omentum and the anterior abdominal wall were reduced through double clamping the omentum at the  insertion to the anterior abdominal wall, transecting the adhesions and ligating using 0 Vicryl suture. A Balfour retractor and bladder blade was then placed within the incision and the intestines were packed from the operative field. The uterus was elevated through the incision using Kelly clamps along the insertion of the round ligaments to the uterus. The right and left round ligaments were ligated and subsequently transected near the insertion to the uterus. The upper parametrial tissues including attenuated uterine ovarian pedicles bilaterally were then clamped bilaterally, cut and ligated using 0 Vicryl suture to free the upper uterus in an attempt to deliver more of the uterus through the incision. Of note the ovaries and fallopian tubes were located in the cul-de-sac adherent to the lower uterine segment posteriorly. At this point it became evident for visualization that a multiple myomectomy was needed and using vasopressin and a 20 unit to 50 cc saline dilution the uterus was infiltrated with approximately 20 cc of vasopressin mixture. Using sharp and blunt dissection multiple myomectomies were performed to collapse the uterus and allow for better visualization. At this point the anterior vesicouterine peritoneal fold was then incised and the bladder flap was sharply and bluntly developed without difficulty. At this point both the right and left ureters were identified and traced along their courses along the pelvic sidewalls and both tracted as they moved under the uterine vessels bilaterally.  After securing the uterine vessels bilaterally in the lower uterine segment using 0 Vicryl suture, a supracervical hysterectomy was performed to allow for better visualization given the posterior cul-de-sac adhesions and bulk of the uterus. Attention was then turned to free the posterior cul-de-sac through sharp and blunt dissection and the right ovary and fallopian tube segment were elevated and dissected away  from the lower uterine segment/pelvic sidewall repeatedly identifying the ureter and tracing  it's course. The right ovary and fallopian tube were ultimately removed and sent to pathology. A similar procedure was carried out on the other side. There were several powder burn classic endometriotic implants noted on the right fallopian tube. The cervical stump was then elevated and the bladder flap was sharply and bluntly developed to below the palpable cervix and the paracervical/lower uterine segment tissues were bilaterally clamped cut and ligated using 0 Vicryl suture until the upper vagina was sharply entered and the cervical stump was excised. Right and left vaginal angle sutures were placed using 0 Vicryl suture and the vaginal cuff was run in a running interlocking stitch using 0 Vicryl suture. The vagina was then closed anterior to posterior with 2 figure of 8 interrupted sutures.  The pelvis was copiously irrigated and adequate hemostasis was visualized. Surgicel was placed along the upper vaginal cuff and lower sidewalls to ensure continued hemostasis. The bowel packing was removed, the Balfour retractor and bladder blade were removed and the anterior fascia was approximated using 0 Vicryl suture in a running stitch starting at the angle and meeting in the middle. The subcutaneous tissues were irrigated with hemostasis achieved using electrocautery and the subcutaneous tissues were infiltrated using Exparell one vial per 100 cc saline and approximately 80 cc used. The skin was then reapproximated using 4-0 Vicryl in a running subcuticular stitch. Steri-Strips and benzoin were placed as was a sterile dressing. Cystoscopy was then performed after removing her Foley catheter, bringing her legs up in a frog leg position noting the patient previously received methylene blue intraoperatively and bilateral ureteral jets were noted with normal-appearing bladder mucosa and no evidence of injury. The Foley catheter was  replaced and the patient received intraoperative Toradol. She was awakened without difficulty and taken to the recovery room in good condition having tolerated procedure well.     Anastasio Auerbach MD, 2:18 PM 10/22/2014

## 2014-10-23 ENCOUNTER — Encounter (HOSPITAL_COMMUNITY): Payer: Self-pay | Admitting: Gynecology

## 2014-10-23 DIAGNOSIS — N925 Other specified irregular menstruation: Secondary | ICD-10-CM | POA: Diagnosis not present

## 2014-10-23 LAB — CBC
HCT: 28.9 % — ABNORMAL LOW (ref 36.0–46.0)
Hemoglobin: 9.7 g/dL — ABNORMAL LOW (ref 12.0–15.0)
MCH: 24.4 pg — ABNORMAL LOW (ref 26.0–34.0)
MCHC: 33.6 g/dL (ref 30.0–36.0)
MCV: 72.6 fL — ABNORMAL LOW (ref 78.0–100.0)
PLATELETS: 183 10*3/uL (ref 150–400)
RBC: 3.98 MIL/uL (ref 3.87–5.11)
RDW: 15.3 % (ref 11.5–15.5)
WBC: 12.4 10*3/uL — AB (ref 4.0–10.5)

## 2014-10-23 LAB — GLUCOSE, CAPILLARY: GLUCOSE-CAPILLARY: 122 mg/dL — AB (ref 65–99)

## 2014-10-23 MED ORDER — OXYCODONE-ACETAMINOPHEN 5-325 MG PO TABS: 2.0000 | ORAL_TABLET | ORAL | Status: DC | PRN

## 2014-10-23 MED ORDER — OXYCODONE-ACETAMINOPHEN 5-325 MG PO TABS
2.0000 | ORAL_TABLET | ORAL | Status: DC | PRN
  Administered 2014-10-23 (×2): 2 via ORAL
  Filled 2014-10-23 (×2): qty 2

## 2014-10-23 NOTE — Discharge Instructions (Signed)
°  Postoperative Instructions Hysterectomy ° °Dr. Aarron Wierzbicki and the nursing staff have discussed postoperative instructions with you.  If you have any questions please ask them before you leave the hospital, or call Dr Aneudy Champlain’s office at [phone].   ° °We would like to emphasize the following instructions: ° ° °  Call the office to make your follow-up appointment as recommended by Dr Japneet Staggs (usually 2 weeks). ° °  You were given a prescription, or one was ordered for you at the pharmacy you designated.  Get that prescription filled and take the medication according to instructions. ° °  You may eat a regular diet, but slowly until you start having bowel movements. ° °  Drink plenty of water daily. ° °  Nothing in the vagina (intercourse, douching, objects of any kind) until released by Dr Rettie Laird. ° °  No driving for two weeks.  Wait to be cleared by Dr Jermani Pund at your first post op check.  Car rides (short) are ok after several days at home, as long as you are not having significant pain, but no traveling out of town. ° °  You may shower, but no baths.  Walking up and down stairs is ok.  No heavy lifting, prolonged standing, repeated bending or any “working out” until your first post op check. ° °  Rest frequently, listen to your body and do not push yourself and overdo it. ° °  Call if: ° °o Your pain medication does not seem strong enough. °o Worsening pain or abdominal bloating °o Persistent nausea or vomiting °o Difficulty with urination or bowel movements. °o Temperature of 101 degrees or higher. °o Bleeding heavier then staining (clots or period type flow). °o Incisions become red, tender or begin to drain. °o You have any questions or concerns. °

## 2014-10-23 NOTE — Progress Notes (Signed)
Pt ambulated out teaching complete  

## 2014-10-23 NOTE — Progress Notes (Signed)
Jocelyn Cole 1960-10-11 [id number]   1 Day Post-Op s/p Procedure(s): HYSTERECTOMY ABDOMINAL, CYSTOSCOPY SALPINGO OOPHORECTOMY  Subjective: Patient reports feels well, no acute distress, pain severity reported mild, Yes.   taking PO, foley catheter out, No. voiding, Yes.   ambulating, No. passing flatus  Objective: Vital signs in last 24 hours: Temp:  [97.9 F (36.6 C)-98.7 F (37.1 C)] 98 F (36.7 C) (06/22 0508) Pulse Rate:  [62-76] 65 (06/22 0508) Resp:  [10-21] 18 (06/22 0508) BP: (119-161)/(70-96) 121/76 mmHg (06/22 0508) SpO2:  [96 %-100 %] 96 % (06/22 0508) Weight:  [200 lb (90.719 kg)] 200 lb (90.719 kg) (06/21 1610) Last BM Date: 10/22/14  EXAM General: awake, alert and no distress Resp: clear to auscultation bilaterally Cardio: regular rate and rhythm GI: soft, mild tenderness, bowel sounds active, incisions dressing dry intact Lower Extremities: Without swelling or tenderness Vaginal Bleeding: scant  Lab Results:   Recent Labs  10/23/14 0512  WBC 12.4*  HGB 9.7*  HCT 28.9*  PLT 183    Assessment: s/p Procedure(s): HYSTERECTOMY ABDOMINAL, CYSTOSCOPY SALPINGO OOPHORECTOMY: stable and progressing well  Plan: Continue routine post operative care, Advance diet Encourage ambulation Advance to PO medication Discontinue IV fluids  Reassess later today for possible discharge  LOS: 1 day    Anastasio Auerbach MD, 7:30 AM 10/23/2014

## 2014-10-23 NOTE — Progress Notes (Signed)
Jocelyn Cole May 04, 1960 [id number]   1 Day Post-Op Procedure(s) (LRB): HYSTERECTOMY ABDOMINAL, CYSTOSCOPY (N/A) SALPINGO OOPHORECTOMY (Bilateral)  Subjective: Patient reports feels well, no acute distress and desires discharge this afternoon. Pain severity reported mild, Yes.   taking PO, foley catheter out, Yes.   voiding, Yes.   ambulating, Yes.   passing flatus  Objective: Vital signs in last 24 hours: Temp:  [97.9 F (36.6 C)-98.7 F (37.1 C)] 98.6 F (37 C) (06/22 0900) Pulse Rate:  [62-76] 74 (06/22 0900) Resp:  [10-21] 20 (06/22 0900) BP: (119-147)/(70-96) 124/76 mmHg (06/22 0900) SpO2:  [93 %-100 %] 93 % (06/22 0900) Weight:  [200 lb (90.719 kg)] 200 lb (90.719 kg) (06/21 1610) Last BM Date: 10/22/14    EXAM General: awake, alert and no distress Resp: clear to auscultation bilaterally Cardio: regular rate and rhythm GI: soft, minimal tender, bowel sounds active, dressing dry Lower Extremities: Without swelling or tenderness Vaginal Bleeding: Reported scant   Lab Results:   Recent Labs  10/23/14 0512  WBC 12.4*  HGB 9.7*  HCT 28.9*  PLT 183    Assessment: s/p Procedure(s): HYSTERECTOMY ABDOMINAL, CYSTOSCOPY SALPINGO OOPHORECTOMY: progressing well, ready for discharge.  Eating, voiding, flatus, ambulating with good pain relief on oral medications.  Plan: Discharge home today per patient request.  Precautions, instructions and follow up were discussed with the patient.  Prescriptions provided per AVS.  Patient to call the office to arrange a post-operative appointmant in 2 weeks.    Anastasio Auerbach MD, 1:37 PM 10/23/2014

## 2014-10-24 NOTE — Discharge Summary (Signed)
  Jocelyn Cole 1960/12/31 [id number]   Discharge Summary  Date of Admission:  10/22/2014  Date of Discharge:  10/23/2014  Discharge Diagnosis:  Menorrhagia, leiomyoma, endometrial polyp, anemia due to blood loss  Procedure:  Procedure(s): HYSTERECTOMY ABDOMINAL, CYSTOSCOPY SALPINGO OOPHORECTOMY  Pathology:   Uterus, ovaries and fallopian tubes - CERVIX: MILD CHRONIC INFLAMMATION. - ENDOMETRIUM: BENIGN ENDOMETRIOID TYPE POLYP. - MYOMETRIUM: LEIOMYOMATA. - SEROSA: UNREMARKABLE. - BILATERAL ADNEXA: UNREMARKABLE OVARIES AND FALLOPIAN TUBES.  Hospital Course:  The patient underwent an uncomplicated total abdominal hysterectomy, bilateral salpingo-oophorectomy 10/22/2014. She was discharged on postoperative day #1 doing well tolerating a regular diet and voiding without difficulty with good pain relief on oral medication. Postoperative hemoglobin 9.7. The patient received instructions for postoperative care and call precautions.  She received prescriptions per AVS and will be seen in the office 2 weeks following discharge.       Anastasio Auerbach MD, 2:23 PM 10/24/2014

## 2014-10-28 ENCOUNTER — Other Ambulatory Visit: Payer: Self-pay

## 2014-11-07 ENCOUNTER — Ambulatory Visit (INDEPENDENT_AMBULATORY_CARE_PROVIDER_SITE_OTHER): Payer: No Typology Code available for payment source | Admitting: Gynecology

## 2014-11-07 ENCOUNTER — Encounter: Payer: Self-pay | Admitting: Gynecology

## 2014-11-07 VITALS — BP 124/80

## 2014-11-07 DIAGNOSIS — Z9889 Other specified postprocedural states: Secondary | ICD-10-CM

## 2014-11-07 MED ORDER — OXYCODONE-ACETAMINOPHEN 5-325 MG PO TABS: 1.0000 | ORAL_TABLET | ORAL | Status: DC | PRN

## 2014-11-07 NOTE — Progress Notes (Signed)
Jocelyn Cole [birth year] [id number]        54 y.o.  G1P0101 presents for her first postoperative visit status post TAH/BSO 10/22/2014. Patient reports doing well without issue.  Past medical history,surgical history, problem list, medications, allergies, family history and social history were all reviewed and documented in the EPIC chart.  Directed ROS with pertinent positives and negatives documented in the history of present illness/assessment and plan.  Exam: Kim assistant Filed Vitals:   11/07/14 1057  BP: 124/80   General appearance:  Normal Abdomen soft nontender without masses guarding or rebound. Incision healed nicely. Pelvic external BUS vagina with cuff intact. Bimanual without masses or tenderness.  Assessment/Plan:  54 y.o. G1P0101 with normal postoperative visit status post TAH/BSO. Patient did request if she could have some more pain medicine and she finds herself still uncomfortable particularly at bedtime. Oxycodone 5 mg/325 #20 no refill provided. Follow up in 2 weeks for her next postoperative visit. Slowly resume normal activities with the exception of continued pelvic rest.    Anastasio Auerbach MD, 11:12 AM 11/07/2014

## 2014-11-07 NOTE — Patient Instructions (Signed)
Follow-up in 2 weeks for your next postoperative visit. 

## 2014-11-26 ENCOUNTER — Ambulatory Visit (INDEPENDENT_AMBULATORY_CARE_PROVIDER_SITE_OTHER): Payer: No Typology Code available for payment source | Admitting: Gynecology

## 2014-11-26 ENCOUNTER — Encounter: Payer: Self-pay | Admitting: Gynecology

## 2014-11-26 VITALS — BP 134/84

## 2014-11-26 DIAGNOSIS — Z9889 Other specified postprocedural states: Secondary | ICD-10-CM

## 2014-11-26 NOTE — Patient Instructions (Signed)
Continue with nothing in the vagina for another several weeks. Otherwise slowly resume all normal activities. Follow up in December 2016 when you're due for your annual exam, sooner if any issues.

## 2014-11-26 NOTE — Progress Notes (Signed)
Jocelyn Cole 1960/07/31 [id number]        54 y.o.  G1P0101 presents for her postoperative visit status post TAH bilateral salpingo-oophorectomy 10/22/2014. Doing well without complaints.  Past medical history,surgical history, problem list, medications, allergies, family history and social history were all reviewed and documented in the EPIC chart.  Directed ROS with pertinent positives and negatives documented in the history of present illness/assessment and plan.  Exam: Kim assistant Filed Vitals:   11/26/14 1426  BP: 134/84   General appearance:  Normal Abdomen soft nontender without masses guarding rebound. Incision healed nicely. Pelvic external bus vagina with cuff healing nicely. Bimanual without masses or tenderness.  Assessment/Plan:  54 y.o. G1P0101 was normal postoperative check up 4-5 weeks status post TAH/BSO. Will slowly resume all normal activities with the exception of continued pelvic rest over the next several weeks. She will then resume intercourse gently. Assuming no issues then follow up in December 2016 when due for annual exam, sooner as needed.    Anastasio Auerbach MD, 3:01 PM 11/26/2014

## 2015-04-15 ENCOUNTER — Encounter: Payer: Self-pay | Admitting: Gynecology

## 2015-04-16 ENCOUNTER — Ambulatory Visit (INDEPENDENT_AMBULATORY_CARE_PROVIDER_SITE_OTHER): Payer: No Typology Code available for payment source | Admitting: Gynecology

## 2015-04-16 ENCOUNTER — Encounter: Payer: Self-pay | Admitting: Gynecology

## 2015-04-16 VITALS — BP 124/76 | Ht 61.5 in | Wt 205.0 lb

## 2015-04-16 DIAGNOSIS — N939 Abnormal uterine and vaginal bleeding, unspecified: Secondary | ICD-10-CM

## 2015-04-16 DIAGNOSIS — Z01419 Encounter for gynecological examination (general) (routine) without abnormal findings: Secondary | ICD-10-CM

## 2015-04-16 NOTE — Patient Instructions (Signed)
Follow up if the vaginal bleeding continues.  You may obtain a copy of any labs that were done today by logging onto MyChart as outlined in the instructions provided with your AVS (after visit summary). The office will not call with normal lab results but certainly if there are any significant abnormalities then we will contact you.   Health Maintenance Adopting a healthy lifestyle and getting preventive care can go a long way to promote health and wellness. Talk with your health care provider about what schedule of regular examinations is right for you. This is a good chance for you to check in with your provider about disease prevention and staying healthy. In between checkups, there are plenty of things you can do on your own. Experts have done a lot of research about which lifestyle changes and preventive measures are most likely to keep you healthy. Ask your health care provider for more information. WEIGHT AND DIET  Eat a healthy diet  Be sure to include plenty of vegetables, fruits, low-fat dairy products, and lean protein.  Do not eat a lot of foods high in solid fats, added sugars, or salt.  Get regular exercise. This is one of the most important things you can do for your health.  Most adults should exercise for at least 150 minutes each week. The exercise should increase your heart rate and make you sweat (moderate-intensity exercise).  Most adults should also do strengthening exercises at least twice a week. This is in addition to the moderate-intensity exercise.  Maintain a healthy weight  Body mass index (BMI) is a measurement that can be used to identify possible weight problems. It estimates body fat based on height and weight. Your health care provider can help determine your BMI and help you achieve or maintain a healthy weight.  For females 17 years of age and older:   A BMI below 18.5 is considered underweight.  A BMI of 18.5 to 24.9 is normal.  A BMI of 25 to 29.9  is considered overweight.  A BMI of 30 and above is considered obese.  Watch levels of cholesterol and blood lipids  You should start having your blood tested for lipids and cholesterol at 54 years of age, then have this test every 5 years.  You may need to have your cholesterol levels checked more often if:  Your lipid or cholesterol levels are high.  You are older than 54 years of age.  You are at high risk for heart disease.  CANCER SCREENING   Lung Cancer  Lung cancer screening is recommended for adults 36-70 years old who are at high risk for lung cancer because of a history of smoking.  A yearly low-dose CT scan of the lungs is recommended for people who:  Currently smoke.  Have quit within the past 15 years.  Have at least a 30-pack-year history of smoking. A pack year is smoking an average of one pack of cigarettes a day for 1 year.  Yearly screening should continue until it has been 15 years since you quit.  Yearly screening should stop if you develop a health problem that would prevent you from having lung cancer treatment.  Breast Cancer  Practice breast self-awareness. This means understanding how your breasts normally appear and feel.  It also means doing regular breast self-exams. Let your health care provider know about any changes, no matter how small.  If you are in your 20s or 30s, you should have a clinical breast exam (  CBE) by a health care provider every 1-3 years as part of a regular health exam.  If you are 56 or older, have a CBE every year. Also consider having a breast X-ray (mammogram) every year.  If you have a family history of breast cancer, talk to your health care provider about genetic screening.  If you are at high risk for breast cancer, talk to your health care provider about having an MRI and a mammogram every year.  Breast cancer gene (BRCA) assessment is recommended for women who have family members with BRCA-related cancers.  BRCA-related cancers include:  Breast.  Ovarian.  Tubal.  Peritoneal cancers.  Results of the assessment will determine the need for genetic counseling and BRCA1 and BRCA2 testing. Cervical Cancer Routine pelvic examinations to screen for cervical cancer are no longer recommended for nonpregnant women who are considered low risk for cancer of the pelvic organs (ovaries, uterus, and vagina) and who do not have symptoms. A pelvic examination may be necessary if you have symptoms including those associated with pelvic infections. Ask your health care provider if a screening pelvic exam is right for you.   The Pap test is the screening test for cervical cancer for women who are considered at risk.  If you had a hysterectomy for a problem that was not cancer or a condition that could lead to cancer, then you no longer need Pap tests.  If you are older than 65 years, and you have had normal Pap tests for the past 10 years, you no longer need to have Pap tests.  If you have had past treatment for cervical cancer or a condition that could lead to cancer, you need Pap tests and screening for cancer for at least 20 years after your treatment.  If you no longer get a Pap test, assess your risk factors if they change (such as having a new sexual partner). This can affect whether you should start being screened again.  Some women have medical problems that increase their chance of getting cervical cancer. If this is the case for you, your health care provider may recommend more frequent screening and Pap tests.  The human papillomavirus (HPV) test is another test that may be used for cervical cancer screening. The HPV test looks for the virus that can cause cell changes in the cervix. The cells collected during the Pap test can be tested for HPV.  The HPV test can be used to screen women 39 years of age and older. Getting tested for HPV can extend the interval between normal Pap tests from three to  five years.  An HPV test also should be used to screen women of any age who have unclear Pap test results.  After 54 years of age, women should have HPV testing as often as Pap tests.  Colorectal Cancer  This type of cancer can be detected and often prevented.  Routine colorectal cancer screening usually begins at 54 years of age and continues through 54 years of age.  Your health care provider may recommend screening at an earlier age if you have risk factors for colon cancer.  Your health care provider may also recommend using home test kits to check for hidden blood in the stool.  A small camera at the end of a tube can be used to examine your colon directly (sigmoidoscopy or colonoscopy). This is done to check for the earliest forms of colorectal cancer.  Routine screening usually begins at age 77.  Direct examination of the colon should be repeated every 5-10 years through 54 years of age. However, you may need to be screened more often if early forms of precancerous polyps or small growths are found. Skin Cancer  Check your skin from head to toe regularly.  Tell your health care provider about any new moles or changes in moles, especially if there is a change in a mole's shape or color.  Also tell your health care provider if you have a mole that is larger than the size of a pencil eraser.  Always use sunscreen. Apply sunscreen liberally and repeatedly throughout the day.  Protect yourself by wearing long sleeves, pants, a wide-brimmed hat, and sunglasses whenever you are outside. HEART DISEASE, DIABETES, AND HIGH BLOOD PRESSURE   Have your blood pressure checked at least every 1-2 years. High blood pressure causes heart disease and increases the risk of stroke.  If you are between 76 years and 55 years old, ask your health care provider if you should take aspirin to prevent strokes.  Have regular diabetes screenings. This involves taking a blood sample to check your  fasting blood sugar level.  If you are at a normal weight and have a low risk for diabetes, have this test once every three years after 54 years of age.  If you are overweight and have a high risk for diabetes, consider being tested at a younger age or more often. PREVENTING INFECTION  Hepatitis B  If you have a higher risk for hepatitis B, you should be screened for this virus. You are considered at high risk for hepatitis B if:  You were born in a country where hepatitis B is common. Ask your health care provider which countries are considered high risk.  Your parents were born in a high-risk country, and you have not been immunized against hepatitis B (hepatitis B vaccine).  You have HIV or AIDS.  You use needles to inject street drugs.  You live with someone who has hepatitis B.  You have had sex with someone who has hepatitis B.  You get hemodialysis treatment.  You take certain medicines for conditions, including cancer, organ transplantation, and autoimmune conditions. Hepatitis C  Blood testing is recommended for:  Everyone born from [birth year] through 1965.  Anyone with known risk factors for hepatitis C. Sexually transmitted infections (STIs)  You should be screened for sexually transmitted infections (STIs) including gonorrhea and chlamydia if:  You are sexually active and are younger than 54 years of age.  You are older than 54 years of age and your health care provider tells you that you are at risk for this type of infection.  Your sexual activity has changed since you were last screened and you are at an increased risk for chlamydia or gonorrhea. Ask your health care provider if you are at risk.  If you do not have HIV, but are at risk, it may be recommended that you take a prescription medicine daily to prevent HIV infection. This is called pre-exposure prophylaxis (PrEP). You are considered at risk if:  You are sexually active and do not regularly use condoms or  know the HIV status of your partner(s).  You take drugs by injection.  You are sexually active with a partner who has HIV. Talk with your health care provider about whether you are at high risk of being infected with HIV. If you choose to begin PrEP, you should first be tested for HIV. You should then be  tested every 3 months for as long as you are taking PrEP.  PREGNANCY   If you are premenopausal and you may become pregnant, ask your health care provider about preconception counseling.  If you may become pregnant, take 400 to 800 micrograms (mcg) of folic acid every day.  If you want to prevent pregnancy, talk to your health care provider about birth control (contraception). OSTEOPOROSIS AND MENOPAUSE   Osteoporosis is a disease in which the bones lose minerals and strength with aging. This can result in serious bone fractures. Your risk for osteoporosis can be identified using a bone density scan.  If you are 25 years of age or older, or if you are at risk for osteoporosis and fractures, ask your health care provider if you should be screened.  Ask your health care provider whether you should take a calcium or vitamin D supplement to lower your risk for osteoporosis.  Menopause may have certain physical symptoms and risks.  Hormone replacement therapy may reduce some of these symptoms and risks. Talk to your health care provider about whether hormone replacement therapy is right for you.  HOME CARE INSTRUCTIONS   Schedule regular health, dental, and eye exams.  Stay current with your immunizations.   Do not use any tobacco products including cigarettes, chewing tobacco, or electronic cigarettes.  If you are pregnant, do not drink alcohol.  If you are breastfeeding, limit how much and how often you drink alcohol.  Limit alcohol intake to no more than 1 drink per day for nonpregnant women. One drink equals 12 ounces of beer, 5 ounces of wine, or 1 ounces of hard liquor.  Do  not use street drugs.  Do not share needles.  Ask your health care provider for help if you need support or information about quitting drugs.  Tell your health care provider if you often feel depressed.  Tell your health care provider if you have ever been abused or do not feel safe at home. Document Released: 11/02/2010 Document Revised: 09/03/2013 Document Reviewed: 03/21/2013 [facility] Patient Information 2015 [place], [place]. This information is not intended to replace advice given to you by your health care provider. Make sure you discuss any questions you have with your health care provider.

## 2015-04-16 NOTE — Progress Notes (Signed)
Jocelyn Cole [birth year] [id number]        54 y.o.  G1P0101  for annual exam.  Doing well. Does note vaginal spotting sometimes after intercourse.  Is status post TAH/BSO 10/2014 for leiomyoma.    Past medical history,surgical history, problem list, medications, allergies, family history and social history were all reviewed and documented as reviewed in the EPIC chart.  ROS:  Performed with pertinent positives and negatives included in the history, assessment and plan.   Additional significant findings :  none   Exam: Kim Counsellor Vitals:   04/16/15 1151  BP: 124/76  Height: 5' 1.5" (1.562 m)  Weight: 205 lb (92.987 kg)   General appearance:  Normal affect, orientation and appearance. Skin: Grossly normal HEENT: Without gross lesions.  No cervical or supraclavicular adenopathy. Thyroid normal.  Lungs:  Clear without wheezing, rales or rhonchi Cardiac: RR, without RMG Abdominal:  Soft, nontender, without masses, guarding, rebound, organomegaly or hernia Breasts:  Examined lying and sitting without masses, retractions, discharge or axillary adenopathy. Pelvic:  Ext/BUS/vagina with area of granulation tissue at the cuff. Silver nitrate applied  Adnexa  Without masses or tenderness    Anus and perineum  Normal   Rectovaginal  Normal sphincter tone without palpated masses or tenderness.    Assessment/Plan:  54 y.o. G72P0101 female for annual exam. Status post TAH/BSO for bleeding, leiomyoma and pain.  1. Vaginal spotting after hysterectomy. Area of granulation tissue noted at the vaginal cuff. Silver nitrate applied. Patient will follow up if symptoms persist. Otherwise doing well without significant hot flashes, sweats or vaginal dryness. Will follow up if the spotting continues or recurs. Otherwise we'll follow expectantly. 2.  Mammography12/13/2016. Continue with annual mammography next year. SBE monthly reviewed. 3. Colonoscopy 2012. Repeat at their recommended  interval. 4. Pap smear/HPV negative 2015.  No Pap smear done today. Options to stop screening altogether as she is status post hysterectomy for benign indications in no history of significant abnormal Pap smears per current screening guidelines or less frequent screening intervals reviewed. Will readdress on annual basis. 5. DEXA never. Will plan further into the menopause. Increase calcium vitamin D reviewed. 6. Health maintenance. No routine blood work done as the patient has this done at her other physician's offices. Follow up in one year, sooner as needed.   Anastasio Auerbach MD, 12:10 PM 04/16/2015

## 2015-04-17 LAB — URINALYSIS W MICROSCOPIC + REFLEX CULTURE
BACTERIA UA: NONE SEEN [HPF]
Bilirubin Urine: NEGATIVE
Casts: NONE SEEN [LPF]
Crystals: NONE SEEN [HPF]
Glucose, UA: NEGATIVE
Hgb urine dipstick: NEGATIVE
Ketones, ur: NEGATIVE
NITRITE: NEGATIVE
PH: 6 (ref 5.0–8.0)
PROTEIN: NEGATIVE
Specific Gravity, Urine: 1.017 (ref 1.001–1.035)
YEAST: NONE SEEN [HPF]

## 2015-04-18 LAB — URINE CULTURE
COLONY COUNT: NO GROWTH
Organism ID, Bacteria: NO GROWTH

## 2015-05-23 ENCOUNTER — Ambulatory Visit (INDEPENDENT_AMBULATORY_CARE_PROVIDER_SITE_OTHER): Payer: No Typology Code available for payment source | Admitting: Family Medicine

## 2015-05-23 ENCOUNTER — Ambulatory Visit (INDEPENDENT_AMBULATORY_CARE_PROVIDER_SITE_OTHER): Payer: No Typology Code available for payment source

## 2015-05-23 VITALS — BP 124/82 | HR 98 | Temp 98.4°F | Resp 20 | Ht 61.5 in | Wt 208.4 lb

## 2015-05-23 DIAGNOSIS — J988 Other specified respiratory disorders: Secondary | ICD-10-CM | POA: Diagnosis not present

## 2015-05-23 DIAGNOSIS — R05 Cough: Secondary | ICD-10-CM

## 2015-05-23 DIAGNOSIS — R058 Other specified cough: Secondary | ICD-10-CM

## 2015-05-23 DIAGNOSIS — J22 Unspecified acute lower respiratory infection: Secondary | ICD-10-CM

## 2015-05-23 MED ORDER — GUAIFENESIN ER 1200 MG PO TB12: 1.0000 | ORAL_TABLET | Freq: Two times a day (BID) | ORAL | Status: DC | PRN

## 2015-05-23 MED ORDER — HYDROCOD POLST-CPM POLST ER 10-8 MG/5ML PO SUER: 5.0000 mL | Freq: Every evening | ORAL | Status: DC | PRN

## 2015-05-23 MED ORDER — AZITHROMYCIN 250 MG PO TABS: ORAL_TABLET | ORAL | Status: DC

## 2015-05-23 NOTE — Patient Instructions (Signed)
Please hydrate well. Please take medication as prescribed.   Cough, Adult A cough helps to clear your throat and lungs. A cough may last only 2-3 weeks (acute), or it may last longer than 8 weeks (chronic). Many different things can cause a cough. A cough may be a sign of an illness or another medical condition. HOME CARE  Pay attention to any changes in your cough.  Take medicines only as told by your doctor.  If you were prescribed an antibiotic medicine, take it as told by your doctor. Do not stop taking it even if you start to feel better.  Talk with your doctor before you try using a cough medicine.  Drink enough fluid to keep your pee (urine) clear or pale yellow.  If the air is dry, use a cold steam vaporizer or humidifier in your home.  Stay away from things that make you cough at work or at home.  If your cough is worse at night, try using extra pillows to raise your head up higher while you sleep.  Do not smoke, and try not to be around smoke. If you need help quitting, ask your doctor.  Do not have caffeine.  Do not drink alcohol.  Rest as needed. GET HELP IF:  You have new problems (symptoms).  You cough up yellow fluid (pus).  Your cough does not get better after 2-3 weeks, or your cough gets worse.  Medicine does not help your cough and you are not sleeping well.  You have pain that gets worse or pain that is not helped with medicine.  You have a fever.  You are losing weight and you do not know why.  You have night sweats. GET HELP RIGHT AWAY IF:  You cough up blood.  You have trouble breathing.  Your heartbeat is very fast.   This information is not intended to replace advice given to you by your health care provider. Make sure you discuss any questions you have with your health care provider.   Document Released: 12/31/2010 Document Revised: 01/08/2015 Document Reviewed: 06/26/2014 Elsevier Interactive Patient Education [employer].

## 2015-05-23 NOTE — Progress Notes (Signed)
[facility] [address], [place] [address] [phone]  Date:  05/23/2015   Name:  Jocelyn Cole   DOB:  December 27, 1960   MRN:  [id number]  PCP:  Anastasio Auerbach, MD   Chief Complaint  Patient presents with  . Cough    x 1 month, in the night is more worse.    History of Present Illness:  Jocelyn Cole is a 55 y.o. female patient who presents to [facility] for cc of 1 month of cough.  1 month of a cough.  Symptoms started with cold-like symptoms--nasal congestion, cough, and sorethroat.  All symptoms resolved besides a lingering cough after 1 week.  The cough has productive clear sputum, that has now worsened at night.  She may have a coughing spell during the day as well.  She feels that she has post nasal discharge.  She has minimal fatigue.  She has been taking theraflu, nyquil, and alka-seltzer which works for the period and then she will start her coughing.  No nasal congestion.  No fevers.  There is no sob or dyspnea, but heavy movement may result in some windedness.    She has been hydrating well.  Non-smoker.  Patient Active Problem List   Diagnosis Date Noted  . Diabetes mellitus     Past Medical History  Diagnosis Date  . Anemia     takes Iron PO daily  . Diabetes mellitus without complication [facility])     Past Surgical History  Procedure Laterality Date  . Appendectomy  AGE 89  . Colonoscopy  03/02/2011    DR.   Marland Kitchen Abdominal hysterectomy N/A 10/22/2014    Procedure: HYSTERECTOMY ABDOMINAL, CYSTOSCOPY;  Surgeon: Anastasio Auerbach, MD;  Location: [facility];  Service: Gynecology;  Laterality: N/A;  request to follow 7:30am case.  Requested 2 hours OR time for case  . Salpingoophorectomy Bilateral 10/22/2014    Procedure: SALPINGO OOPHORECTOMY;  Surgeon: Anastasio Auerbach, MD;  Location: [facility];  Service: Gynecology;  Laterality: Bilateral;    Social History  Substance Use Topics  . Smoking status: Never Smoker   . Smokeless tobacco: Never  Used  . Alcohol Use: 0.0 oz/week    0 Standard drinks or equivalent per week     Comment: Rare    Family History  Problem Relation Age of Onset  . Hypertension Mother   . Diabetes Mother   . Diabetes Father   . Heart disease Father   . Cancer Father     COLON CANCER-Stomach cancer  . Colon cancer Father   . Stomach cancer Father   . Hypertension Sister   . Heart failure Sister   . Hypertension Brother   . Diabetes Brother   . Heart failure Brother   . Hypertension Brother   . Hypertension Brother   . Diabetes Sister   . Hypertension Sister   . Hypertension Sister     No Known Allergies  Medication list has been reviewed and updated.  Current Outpatient Prescriptions on File Prior to Visit  Medication Sig Dispense Refill  . Cholecalciferol (VITAMIN D) 2000 UNITS tablet Take 2,000 Units by mouth daily.    Marland Kitchen glipiZIDE-metformin (METAGLIP) 2.5-500 MG per tablet Take 1 tablet by mouth daily.    . IRON PO Take by mouth.       No current facility-administered medications on file prior to visit.    ROS ROS otherwise unremarkable unless listed above.   Physical Examination: BP 124/82  mmHg  Pulse 98  Temp(Src) 98.4 F (36.9 C) (Oral)  Resp 20  Ht 5' 1.5" (1.562 m)  Wt 208 lb 6.4 oz (94.53 kg)  BMI 38.74 kg/m2  SpO2 96%  LMP 02/10/2014 Ideal Body Weight: Weight in (lb) to have BMI = 25: 134.2  Physical Exam  Constitutional: She is oriented to person, place, and time. She appears well-developed and well-nourished. No distress.  HENT:  Head: Normocephalic and atraumatic.  Right Ear: Tympanic membrane, external ear and ear canal normal.  Left Ear: Tympanic membrane, external ear and ear canal normal.  Nose: Mucosal edema and rhinorrhea present. Right sinus exhibits no maxillary sinus tenderness and no frontal sinus tenderness. Left sinus exhibits no maxillary sinus tenderness and no frontal sinus tenderness.  Mouth/Throat: No uvula swelling. No oropharyngeal exudate,  posterior oropharyngeal edema or posterior oropharyngeal erythema.  Eyes: Conjunctivae and EOM are normal. Pupils are equal, round, and reactive to light.  Cardiovascular: Normal rate and regular rhythm.  Exam reveals no gallop, no distant heart sounds and no friction rub.   No murmur heard. Pulmonary/Chest: Effort normal. No respiratory distress. She has no decreased breath sounds. She has no wheezes. She has no rhonchi.  Lymphadenopathy:       Head (right side): No submandibular, no tonsillar, no preauricular and no posterior auricular adenopathy present.       Head (left side): No submandibular, no tonsillar, no preauricular and no posterior auricular adenopathy present.  Neurological: She is alert and oriented to person, place, and time.  Skin: She is not diaphoretic.  Psychiatric: She has a normal mood and affect. Her behavior is normal.     [facility] reading (PRIMARY) by  Dr. Carlota Raspberry: Negative.   Assessment and Plan: Jocelyn Cole is a 55 y.o. female who is here today for productive cough. Treating with abx at this time. Advised of residual cough length and alarming factors.  Patient voiced understanding.     Lower respiratory infection - Plan: azithromycin (ZITHROMAX) 250 MG tablet, Guaifenesin (MUCINEX MAXIMUM STRENGTH) 1200 MG TB12, chlorpheniramine-HYDROcodone (TUSSIONEX PENNKINETIC ER) 10-8 MG/5ML SUER  Productive cough - Plan: DG Chest 2 View, CANCELED: POCT CBC   Ivar Drape, PA-C [facility] 05/23/2015 11:10 AM

## 2015-05-24 NOTE — Progress Notes (Signed)
Xray read and patient discussed with Ms. English. Agree with assessment and plan of care per her note.   

## 2016-01-12 ENCOUNTER — Encounter: Payer: Self-pay | Admitting: Gastroenterology

## 2016-03-12 ENCOUNTER — Ambulatory Visit (INDEPENDENT_AMBULATORY_CARE_PROVIDER_SITE_OTHER): Payer: No Typology Code available for payment source | Admitting: Urgent Care

## 2016-03-12 VITALS — BP 128/86 | HR 86 | Temp 98.3°F | Resp 16 | Ht 61.5 in | Wt 209.8 lb

## 2016-03-12 DIAGNOSIS — E119 Type 2 diabetes mellitus without complications: Secondary | ICD-10-CM | POA: Diagnosis not present

## 2016-03-12 DIAGNOSIS — Z8639 Personal history of other endocrine, nutritional and metabolic disease: Secondary | ICD-10-CM | POA: Diagnosis not present

## 2016-03-12 DIAGNOSIS — D649 Anemia, unspecified: Secondary | ICD-10-CM | POA: Diagnosis not present

## 2016-03-12 DIAGNOSIS — Z1159 Encounter for screening for other viral diseases: Secondary | ICD-10-CM

## 2016-03-12 DIAGNOSIS — Z114 Encounter for screening for human immunodeficiency virus [HIV]: Secondary | ICD-10-CM | POA: Diagnosis not present

## 2016-03-12 DIAGNOSIS — Z0000 Encounter for general adult medical examination without abnormal findings: Secondary | ICD-10-CM

## 2016-03-12 LAB — HEPATITIS C ANTIBODY: HCV AB: NEGATIVE

## 2016-03-12 LAB — HIV ANTIBODY (ROUTINE TESTING W REFLEX): HIV 1&2 Ab, 4th Generation: NONREACTIVE

## 2016-03-12 NOTE — Progress Notes (Signed)
MRN: [id number]  Subjective:   [name] is a 55 y.o. female presenting for annual physical exam.  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 She is married, sexually active with her husband only. She is agreeable to one time screen for HIV, Hep C. Denies smoking cigarettes or drinking alcohol.   Medical care team includes: PCP: Anastasio Auerbach, MD - Gynecologist. Dr. Chalmers Cater is her PCP with [facility], [place]. Vision: Last eye exam 10/2015, no new findings, diabetic retinopathy. Dental: Cleanings every 6 months. Next appointment 04/2016. OB/GYN: Gyn office visit and mammogram scheduled for 04/2016. Specialists: None.  Health Maintenance: Colonoscopy completed in 2015, normal findings.   Jocelyn Cole has Diabetes mellitus on her problem list.  Jocelyn Cole has a current medication list which includes the following prescription(s): vitamin d, glipizide-metformin, and iron. She has No Known Allergies.  Jocelyn Cole  has a past medical history of Anemia and Diabetes mellitus without complication ([place]). Also  has a past surgical history that includes Appendectomy (AGE 69); Colonoscopy (03/02/2011); Abdominal hysterectomy (N/A, 10/22/2014); and Salpingoophorectomy (Bilateral, 10/22/2014).  Her family history includes Cancer in her father; Colon cancer in her father; Diabetes in her brother, father, mother, and sister; Heart disease in her father; Heart failure in her brother and sister; Hypertension in her brother, brother, brother, mother, sister, sister, and sister; Stomach cancer in her father.  Immunizations: Patient refused immunizations today.  Review of Systems  Constitutional: Negative for chills, diaphoresis, fever, malaise/fatigue and weight loss.  HENT: Negative for congestion, ear discharge, ear pain, hearing loss, nosebleeds, sore throat and tinnitus.   Eyes: Negative for blurred vision, double vision, photophobia, pain, discharge and redness.    Respiratory: Negative for cough, shortness of breath and wheezing.   Cardiovascular: Negative for chest pain, palpitations and leg swelling.  Gastrointestinal: Negative for abdominal pain, blood in stool, constipation, diarrhea, nausea and vomiting.  Genitourinary: Negative for dysuria, flank pain, frequency, hematuria and urgency.  Musculoskeletal: Negative for back pain, joint pain and myalgias.  Skin: Negative for itching and rash.  Neurological: Negative for dizziness, tingling, seizures, loss of consciousness, weakness and headaches.  Endo/Heme/Allergies: Negative for polydipsia.  Psychiatric/Behavioral: Negative for depression, hallucinations, memory loss, substance abuse and suicidal ideas. The patient is not nervous/anxious and does not have insomnia.      Objective:   Vitals: BP 128/86 (BP Location: Right Arm, Patient Position: Sitting, Cuff Size: Small)   Pulse 86   Temp 98.3 F (36.8 C) (Oral)   Resp 16   Ht 5' 1.5" (1.562 m)   Wt 209 lb 12.8 oz (95.2 kg)   LMP 02/10/2014   SpO2 98%   BMI 39.00 kg/m   Physical Exam  Constitutional: She is oriented to person, place, and time. She appears well-developed and well-nourished.  HENT:  TM's intact bilaterally, no effusions or erythema. Nasal turbinates pink and moist, nasal passages patent. No sinus tenderness. Oropharynx clear, mucous membranes moist, dentition in good repair.  Eyes: Conjunctivae and EOM are normal. Pupils are equal, round, and reactive to light. Right eye exhibits no discharge. Left eye exhibits no discharge. No scleral icterus.  Neck: Normal range of motion. Neck supple. No thyromegaly present.  Cardiovascular: Normal rate, regular rhythm and intact distal pulses.  Exam reveals no gallop and no friction rub.   No murmur heard. Pulmonary/Chest: No respiratory distress. She has no wheezes. She has no rales.  Abdominal: Soft. Bowel sounds are normal. She exhibits no distension and no mass. There  is no  tenderness.  Musculoskeletal: Normal range of motion. She exhibits no edema or tenderness.  Lymphadenopathy:    She has no cervical adenopathy.  Neurological: She is alert and oriented to person, place, and time. She has normal reflexes.  Skin: Skin is warm and dry. No rash noted. No erythema. No pallor.  Psychiatric: She has a normal mood and affect.   Diabetic Foot Form - Detailed   No data filed     Assessment and Plan :   1. Annual physical exam 2. Need for hepatitis C screening test 3. Screening for HIV without presence of risk factors - Medically healthy, pleasant woman. Discussed healthy lifestyle, diet, exercise, preventative care, vaccinations, and addressed patient's concerns.  - Hepatitis C antibody - HIV antibody  4. Type 2 diabetes mellitus without complication, without long-term current use of insulin (HCC) - Stable, continue f/u with Dr. Chalmers Cater  5. Anemia, unspecified type - Stable, continue iron supplementation  6. History of vitamin D deficiency - Stable, continue current regimen  Jaynee Eagles, PA-C [facility] [phone] 03/12/2016  8:25 AM

## 2016-03-12 NOTE — Patient Instructions (Addendum)
Keeping You Healthy  Get These Tests  Blood Pressure- Have your blood pressure checked by your healthcare provider at least once a year.  Normal blood pressure is 120/80.  Weight- Have your body mass index (BMI) calculated to screen for obesity.  BMI is a measure of body fat based on height and weight.  You can calculate your own BMI at [email or web address].com/bmi/  Cholesterol- Have your cholesterol checked every year.  Diabetes- Have your blood sugar checked every year if you have high blood pressure, high cholesterol, a family history of diabetes or if you are overweight.  Pap Test - Have a pap test every 1 to 5 years if you have been sexually active.  If you are older than 65 and recent pap tests have been normal you may not need additional pap tests.  In addition, if you have had a hysterectomy  for benign disease additional pap tests are not necessary.  Mammogram-Yearly mammograms are essential for early detection of breast cancer  Screening for Colon Cancer- Colonoscopy starting at age 50. Screening may begin sooner depending on your family history and other health conditions.  Follow up colonoscopy as directed by your Gastroenterologist.  Screening for Osteoporosis- Screening begins at age 65 with bone density scanning, sooner if you are at higher risk for developing Osteoporosis.  Get these medicines  Calcium with Vitamin D- Your body requires 1200-1500 mg of Calcium a day and 800-1000 IU of Vitamin D a day.  You can only absorb 500 mg of Calcium at a time therefore Calcium must be taken in 2 or 3 separate doses throughout the day.  Hormones- Hormone therapy has been associated with increased risk for certain cancers and heart disease.  Talk to your healthcare provider about if you need relief from menopausal symptoms.  Aspirin- Ask your healthcare provider about taking Aspirin to prevent Heart Disease and Stroke.  Get these Immuniztions  Flu shot- Every fall  Pneumonia shot-  Once after the age of 65; if you are younger ask your healthcare provider if you need a pneumonia shot.  Tetanus- Every ten years.  Zostavax- Once after the age of 60 to prevent shingles.  Take these steps  Don't smoke- Your healthcare provider can help you quit. For tips on how to quit, ask your healthcare provider or go to www.smokefree.gov or call 1-800 QUIT-NOW.  Be physically active- Exercise 5 days a week for a minimum of 30 minutes.  If you are not already physically active, start slow and gradually work up to 30 minutes of moderate physical activity.  Try walking, dancing, bike riding, swimming, etc.  Eat a healthy diet- Eat a variety of healthy foods such as fruits, vegetables, whole grains, low fat milk, low fat cheeses, yogurt, lean meats, chicken, fish, eggs, dried beans, tofu, etc.  For more information go to www.thenutritionsource.org  Dental visit- Brush and floss teeth twice daily; visit your dentist twice a year.  Eye exam- Visit your Optometrist or Ophthalmologist yearly.  Drink alcohol in moderation- Limit alcohol intake to one drink or less a day.  Never drink and drive.  Depression- Your emotional health is as important as your physical health.  If you're feeling down or losing interest in things you normally enjoy, please talk to your healthcare provider.  Seat Belts- can save your life; always wear one  Smoke/Carbon Monoxide detectors- These detectors need to be installed on the appropriate level of your home.  Replace batteries at least once a year.  Violence- If   anyone is threatening or hurting you, please tell your healthcare provider.  Living Will/ Health care power of attorney- Discuss with your healthcare provider and family.    Tdap Vaccine (Tetanus, Diphtheria and Pertussis): What You Need to Know 1. Why get vaccinated? Tetanus, diphtheria and pertussis are very serious diseases. Tdap vaccine can protect [place] from these diseases. And, Tdap vaccine given  to pregnant women can protect newborn babies against pertussis. TETANUS (Lockjaw) is rare in the [place] today. It causes painful muscle tightening and stiffness, usually all over the body.  It can lead to tightening of muscles in the head and neck so you can't open your mouth, swallow, or sometimes even breathe. Tetanus kills about 1 out of 10 people who are infected even after receiving the best medical care. DIPHTHERIA is also rare in the [place] today. It can cause a thick coating to form in the back of the throat.  It can lead to breathing problems, heart failure, paralysis, and death. PERTUSSIS (Whooping Cough) causes severe coughing spells, which can cause difficulty breathing, vomiting and disturbed sleep.  It can also lead to weight loss, incontinence, and rib fractures. Up to 2 in 100 adolescents and 5 in 100 adults with pertussis are hospitalized or have complications, which could include pneumonia or death. These diseases are caused by bacteria. Diphtheria and pertussis are spread from person to person through secretions from coughing or sneezing. Tetanus enters the body through cuts, scratches, or wounds. Before vaccines, as many as 200,000 cases of diphtheria, 200,000 cases of pertussis, and hundreds of cases of tetanus, were reported in the [place] each year. Since vaccination began, reports of cases for tetanus and diphtheria have dropped by about 99% and for pertussis by about 80%. 2. Tdap vaccine Tdap vaccine can protect adolescents and adults from tetanus, diphtheria, and pertussis. One dose of Tdap is routinely given at age 34 or 22. People who did not get Tdap at that age should get it as soon as possible. Tdap is especially important for healthcare professionals and anyone having close contact with a baby younger than 12 months. Pregnant women should get a dose of Tdap during every pregnancy, to protect the newborn from pertussis. Infants are most at risk for  severe, life-threatening complications from pertussis. Another vaccine, called Td, protects against tetanus and diphtheria, but not pertussis. A Td booster should be given every 10 years. Tdap may be given as one of these boosters if you have never gotten Tdap before. Tdap may also be given after a severe cut or burn to prevent tetanus infection. Your doctor or the person giving you the vaccine can give you more information. Tdap may safely be given at the same time as other vaccines. 3. Some people should not get this vaccine  A person who has ever had a life-threatening allergic reaction after a previous dose of any diphtheria, tetanus or pertussis containing vaccine, OR has a severe allergy to any part of this vaccine, should not get Tdap vaccine. Tell the person giving the vaccine about any severe allergies.  Anyone who had coma or long repeated seizures within 7 days after a childhood dose of DTP or DTaP, or a previous dose of Tdap, should not get Tdap, unless a cause other than the vaccine was found. They can still get Td.  Talk to your doctor if you:  have seizures or another nervous system problem,  had severe pain or swelling after any vaccine containing diphtheria, tetanus or  pertussis,  ever had a condition called Guillain-Barr Syndrome (GBS),  aren't feeling well on the day the shot is scheduled. 4. Risks With any medicine, including vaccines, there is a chance of side effects. These are usually mild and go away on their own. Serious reactions are also possible but are rare. Most people who get Tdap vaccine do not have any problems with it. Mild problems following Tdap (Did not interfere with activities)  Pain where the shot was given (about 3 in 4 adolescents or 2 in 3 adults)  Redness or swelling where the shot was given (about 1 person in 5)  Mild fever of at least 100.38F (up to about 1 in 25 adolescents or 1 in 100 adults)  Headache (about 3 or 4 people in  10)  Tiredness (about 1 person in 3 or 4)  Nausea, vomiting, diarrhea, stomach ache (up to 1 in 4 adolescents or 1 in 10 adults)  Chills, sore joints (about 1 person in 10)  Body aches (about 1 person in 3 or 4)  Rash, swollen glands (uncommon) Moderate problems following Tdap (Interfered with activities, but did not require medical attention)  Pain where the shot was given (up to 1 in 5 or 6)  Redness or swelling where the shot was given (up to about 1 in 16 adolescents or 1 in 12 adults)  Fever over 102F (about 1 in 100 adolescents or 1 in 250 adults)  Headache (about 1 in 7 adolescents or 1 in 10 adults)  Nausea, vomiting, diarrhea, stomach ache (up to 1 or 3 people in 100)  Swelling of the entire arm where the shot was given (up to about 1 in 500). Severe problems following Tdap (Unable to perform usual activities; required medical attention)  Swelling, severe pain, bleeding and redness in the arm where the shot was given (rare). Problems that could happen after any vaccine:  People sometimes faint after a medical procedure, including vaccination. Sitting or lying down for about 15 minutes can help prevent fainting, and injuries caused by a fall. Tell your doctor if you feel dizzy, or have vision changes or ringing in the ears.  Some people get severe pain in the shoulder and have difficulty moving the arm where a shot was given. This happens very rarely.  Any medication can cause a severe allergic reaction. Such reactions from a vaccine are very rare, estimated at fewer than 1 in a million doses, and would happen within a few minutes to a few hours after the vaccination. As with any medicine, there is a very remote chance of a vaccine causing a serious injury or death. The safety of vaccines is always being monitored. For more information, visit: http://www.aguilar.org/ 5. What if there is a serious problem? What should I look for?  Look for anything that concerns  you, such as signs of a severe allergic reaction, very high fever, or unusual behavior.  Signs of a severe allergic reaction can include hives, swelling of the face and throat, difficulty breathing, a fast heartbeat, dizziness, and weakness. These would usually start a few minutes to a few hours after the vaccination. What should I do?  If you think it is a severe allergic reaction or other emergency that can't wait, call 9-1-1 or get the person to the nearest hospital. Otherwise, call your doctor.  Afterward, the reaction should be reported to the Vaccine Adverse Event Reporting System (VAERS). Your doctor might file this report, or you can do it yourself through  the VAERS web site at www.vaers.SamedayNews.es, or by calling (816)870-8967. VAERS does not give medical advice.  6. The National Vaccine Injury Compensation Program The [employer] Vaccine Injury Compensation Program (VICP) is a federal program that was created to compensate people who may have been injured by certain vaccines. Persons who believe they may have been injured by a vaccine can learn about the program and about filing a claim by calling (517)393-8782 or visiting the [employer] website at GoldCloset.com.ee. There is a time limit to file a claim for compensation. 7. How can I learn more?  Ask your doctor. He or she can give you the vaccine package insert or suggest other sources of information.  Call your local or state health department.  Contact the Centers for Disease Control and Prevention (CDC):  Call (678) 022-6841 (1-800-CDC-INFO) or  Visit CDC's website at http://hunter.com/ CDC Tdap Vaccine VIS (06/26/13)   This information is not intended to replace advice given to you by your health care provider. Make sure you discuss any questions you have with your health care provider.   Document Released: 10/19/2011 Document Revised: 05/10/2014 Document Reviewed: 08/01/2013 Elsevier Interactive Patient Education  2016 Elsevier Inc.    Pneumococcal Polysaccharide Vaccine: What You Need to Know 1. Why get vaccinated? Vaccination can protect older adults (and some children and younger adults) from pneumococcal disease. Pneumococcal disease is caused by bacteria that can spread from person to person through close contact. It can cause ear infections, and it can also lead to more serious infections of the:   Lungs (pneumonia),  Blood (bacteremia), and  Covering of the brain and spinal cord (meningitis). Meningitis can cause deafness and brain damage, and it can be fatal. Anyone can get pneumococcal disease, but children under 32 years of age, people with certain medical conditions, adults over 38 years of age, and cigarette smokers are at the highest risk. About 18,000 older adults die each year from pneumococcal disease in the [place]. Treatment of pneumococcal infections with penicillin and other drugs used to be more effective. But some strains of the disease have become resistant to these drugs. This makes prevention of the disease, through vaccination, even more important. 2. Pneumococcal polysaccharide vaccine (PPSV23) Pneumococcal polysaccharide vaccine (PPSV23) protects against 23 types of pneumococcal bacteria. It will not prevent all pneumococcal disease. PPSV23 is recommended for:  All adults 62 years of age and older,  Anyone 2 through 55 years of age with certain long-term health problems,  Anyone 2 through 55 years of age with a weakened immune system,  Adults 38 through 55 years of age who smoke cigarettes or have asthma. Most people need only one dose of PPSV. A second dose is recommended for certain high-risk groups. People 40 and older should get a dose even if they have gotten one or more doses of the vaccine before they turned 65. Your healthcare provider can give you more information about these recommendations. Most healthy adults develop protection within 2 to 3 weeks of  getting the shot. 3. Some people should not get this vaccine  Anyone who has had a life-threatening allergic reaction to PPSV should not get another dose.  Anyone who has a severe allergy to any component of PPSV should not receive it. Tell your provider if you have any severe allergies.  Anyone who is moderately or severely ill when the shot is scheduled may be asked to wait until they recover before getting the vaccine. Someone with a mild illness can usually be vaccinated.  Children less than 70 years of age should not receive this vaccine.  There is no evidence that PPSV is harmful to either a pregnant woman or to her fetus. However, as a precaution, women who need the vaccine should be vaccinated before becoming pregnant, if possible. 4. Risks of a vaccine reaction With any medicine, including vaccines, there is a chance of side effects. These are usually mild and go away on their own, but serious reactions are also possible. About half of people who get PPSV have mild side effects, such as redness or pain where the shot is given, which go away within about two days. Less than 1 out of 100 people develop a fever, muscle aches, or more severe local reactions. Problems that could happen after any vaccine:  People sometimes faint after a medical procedure, including vaccination. Sitting or lying down for about 15 minutes can help prevent fainting, and injuries caused by a fall. Tell your doctor if you feel dizzy, or have vision changes or ringing in the ears.  Some people get severe pain in the shoulder and have difficulty moving the arm where a shot was given. This happens very rarely.  Any medication can cause a severe allergic reaction. Such reactions from a vaccine are very rare, estimated at about 1 in a million doses, and would happen within a few minutes to a few hours after the vaccination. As with any medicine, there is a very remote chance of a vaccine causing a serious injury or  death. The safety of vaccines is always being monitored. For more information, visit: http://www.aguilar.org/ 5. What if there is a serious reaction? What should I look for? Look for anything that concerns you, such as signs of a severe allergic reaction, very high fever, or unusual behavior.  Signs of a severe allergic reaction can include hives, swelling of the face and throat, difficulty breathing, a fast heartbeat, dizziness, and weakness. These would usually start a few minutes to a few hours after the vaccination. What should I do? If you think it is a severe allergic reaction or other emergency that can't wait, call 9-1-1 or get to the nearest hospital. Otherwise, call your doctor. Afterward, the reaction should be reported to the Vaccine Adverse Event Reporting System (VAERS). Your doctor might file this report, or you can do it yourself through the VAERS web site at www.vaers.SamedayNews.es, or by calling (804)506-0769.  VAERS does not give medical advice. 6. How can I learn more?  Ask your doctor. He or she can give you the vaccine package insert or suggest other sources of information.  Call your local or state health department.  Contact the Centers for Disease Control and Prevention (CDC):  Call (240)649-1705 (1-800-CDC-INFO) or  Visit CDC's website at http://hunter.com/ CDC Pneumococcal Polysaccharide Vaccine VIS (08/24/13)   This information is not intended to replace advice given to you by your health care provider. Make sure you discuss any questions you have with your health care provider.   Document Released: 02/14/2006 Document Revised: 05/10/2014 Document Reviewed: 08/27/2013 Elsevier Interactive Patient Education 2016 [employer].     IF you received an x-ray today, you will receive an invoice from [facility]. Please contact [facility] at [phone] with questions or concerns regarding your invoice.   IF you received labwork today, you  will receive an invoice from [employer]. Please contact [facility] at [phone] with questions or concerns regarding your invoice.   Our billing staff will not be able to  assist you with questions regarding bills from these companies.  You will be contacted with the lab results as soon as they are available. The fastest way to get your results is to activate your My Chart account. Instructions are located on the last page of this paperwork. If you have not heard from [place] regarding the results in 2 weeks, please contact this office.

## 2016-03-16 ENCOUNTER — Encounter: Payer: Self-pay | Admitting: Urgent Care

## 2016-04-15 ENCOUNTER — Encounter: Payer: Self-pay | Admitting: Gynecology

## 2016-04-16 ENCOUNTER — Ambulatory Visit (INDEPENDENT_AMBULATORY_CARE_PROVIDER_SITE_OTHER): Payer: No Typology Code available for payment source | Admitting: Gynecology

## 2016-04-16 ENCOUNTER — Encounter: Payer: Self-pay | Admitting: Gynecology

## 2016-04-16 VITALS — BP 138/86 | Ht 61.0 in | Wt 212.0 lb

## 2016-04-16 DIAGNOSIS — Z01419 Encounter for gynecological examination (general) (routine) without abnormal findings: Secondary | ICD-10-CM | POA: Diagnosis not present

## 2016-04-16 NOTE — Progress Notes (Signed)
    Jocelyn Cole Sep 10, 1960 [id number]        55 y.o.  G1P0101 for annual exam.    Past medical history,surgical history, problem list, medications, allergies, family history and social history were all reviewed and documented as reviewed in the EPIC chart.  ROS:  Performed with pertinent positives and negatives included in the history, assessment and plan.   Additional significant findings :  None   Exam: [name] assistant Vitals:   04/16/16 1118  BP: 138/86  Weight: 212 lb (96.2 kg)  Height: 5\' 1"  (1.549 m)   Body mass index is 40.06 kg/m.  General appearance:  Normal affect, orientation and appearance. Skin: Grossly normal HEENT: Without gross lesions.  No cervical or supraclavicular adenopathy. Thyroid normal.  Lungs:  Clear without wheezing, rales or rhonchi Cardiac: RR, without RMG Abdominal:  Soft, nontender, without masses, guarding, rebound, organomegaly or hernia Breasts:  Examined lying and sitting without masses, retractions, discharge or axillary adenopathy. Pelvic:  Ext, BUS, Vagina normal  Adnexa without masses or tenderness    Anus and perineum normal   Rectovaginal normal sphincter tone without palpated masses or tenderness.    Assessment/Plan:  55 y.o. G32P0101 female for annual exam. Status post TAH/BSO for leiomyoma, bleeding and pain.  1. Postmenopausal. Without significant hot flushes, night sweats, vaginal dryness. Issue of spotting after her hysterectomy last year. Several nitrate applied to granulation tissue. Has had no further spotting and exam today is normal. 2. Mammography yesterday. Continue with annual mammography when due. SBE monthly reviewed. 3. Pap smear/HPV 2015 negative. No Pap smear done today. Reviewed current screening guidelines. Options to stop screening based on hysterectomy history and no history of significant abnormal Pap smears reviewed. Will readdress on annual basis. 4. DEXA never. Will plan further into the  menopause. 5. Colonoscopy 2012 with reported repeat interval 10 years. 6. Maintenance. No routine lab work done as patient reports this done elsewhere. Follow up 1 year, sooner as needed.   Anastasio Auerbach MD, 11:35 AM 04/16/2016

## 2016-04-16 NOTE — Patient Instructions (Signed)

## 2016-11-26 ENCOUNTER — Encounter: Payer: Self-pay | Admitting: Family Medicine

## 2016-11-26 ENCOUNTER — Ambulatory Visit (INDEPENDENT_AMBULATORY_CARE_PROVIDER_SITE_OTHER): Payer: No Typology Code available for payment source | Admitting: Family Medicine

## 2016-11-26 VITALS — BP 140/100 | HR 93 | Temp 98.0°F | Resp 18 | Ht 61.3 in | Wt 204.4 lb

## 2016-11-26 DIAGNOSIS — R030 Elevated blood-pressure reading, without diagnosis of hypertension: Secondary | ICD-10-CM | POA: Diagnosis not present

## 2016-11-26 DIAGNOSIS — E119 Type 2 diabetes mellitus without complications: Secondary | ICD-10-CM | POA: Diagnosis not present

## 2016-11-26 DIAGNOSIS — J069 Acute upper respiratory infection, unspecified: Secondary | ICD-10-CM | POA: Diagnosis not present

## 2016-11-26 DIAGNOSIS — T887XXA Unspecified adverse effect of drug or medicament, initial encounter: Secondary | ICD-10-CM | POA: Diagnosis not present

## 2016-11-26 MED ORDER — GUAIFENESIN ER 600 MG PO TB12: 600.0000 mg | ORAL_TABLET | Freq: Two times a day (BID) | ORAL | Status: DC

## 2016-11-26 MED ORDER — FLUTICASONE PROPIONATE 50 MCG/ACT NA SUSP: 1.0000 | Freq: Every day | NASAL | 2 refills | Status: DC

## 2016-11-26 MED ORDER — BENZONATATE 100 MG PO CAPS: 100.0000 mg | ORAL_CAPSULE | Freq: Two times a day (BID) | ORAL | 0 refills | Status: DC | PRN

## 2016-11-26 NOTE — Patient Instructions (Addendum)
[facility]   IF you received an x-ray today, you will receive an invoice from [facility]. Please contact [facility] at [phone] with questions or concerns regarding your invoice.   IF you received labwork today, you will receive an invoice from [facility]. Please contact [facility] at [phone] with questions or concerns regarding your invoice.   Our billing staff will not be able to assist you with questions regarding bills from these companies.  You will be contacted with the lab results as soon as they are available. The fastest way to get your results is to activate your My Chart account. Instructions are located on the last page of this paperwork. If you have not heard from [place] regarding the results in 2 weeks, please contact this office.     Viral Respiratory Infection A respiratory infection is an illness that affects part of the respiratory system, such as the lungs, nose, or throat. Most respiratory infections are caused by either viruses or bacteria. A respiratory infection that is caused by a virus is called a viral respiratory infection. Common types of viral respiratory infections include:  A cold.  The flu (influenza).  A respiratory syncytial virus (RSV) infection.  How do I know if I have a viral respiratory infection? Most viral respiratory infections cause:  A stuffy or runny nose.  Yellow or green nasal discharge.  A cough.  Sneezing.  Fatigue.  Achy muscles.  A sore throat.  Sweating or chills.  A fever.  A headache.  How are viral respiratory infections treated? If influenza is diagnosed early, it may be treated with an antiviral medicine that shortens the length of time a person has symptoms. Symptoms of viral respiratory infections may be treated with over-the-counter and prescription medicines, such as:  Expectorants. These make it easier to cough up mucus.  Decongestant nasal sprays.  Health care providers  do not prescribe antibiotic medicines for viral infections. This is because antibiotics are designed to kill bacteria. They have no effect on viruses. How do I know if I should stay home from work or school? To avoid exposing others to your respiratory infection, stay home if you have:  A fever.  A persistent cough.  A sore throat.  A runny nose.  Sneezing.  Muscles aches.  Headaches.  Fatigue.  Weakness.  Chills.  Sweating.  Nausea.  Follow these instructions at home:  Rest as much as possible.  Take over-the-counter and prescription medicines only as told by your health care provider.  Drink enough fluid to keep your urine clear or pale yellow. This helps prevent dehydration and helps loosen up mucus.  Gargle with a salt-water mixture 3-4 times per day or as needed. To make a salt-water mixture, completely dissolve -1 tsp of salt in 1 cup of warm water.  Use nose drops made from salt water to ease congestion and soften raw skin around your nose.  Do not drink alcohol.  Do not use tobacco products, including cigarettes, chewing tobacco, and e-cigarettes. If you need help quitting, ask your health care provider. Contact a health care provider if:  Your symptoms last for 10 days or longer.  Your symptoms get worse over time.  You have a fever.  You have severe sinus pain in your face or forehead.  The glands in your jaw or neck become very swollen. Get help right away if:  You feel pain or pressure in your chest.  You have shortness of breath.  You faint or feel  like you will faint.  You have severe and persistent vomiting.  You feel confused or disoriented. This information is not intended to replace advice given to you by your health care provider. Make sure you discuss any questions you have with your health care provider. Document Released: 01/27/2005 Document Revised: 09/25/2015 Document Reviewed: 09/25/2014 Elsevier Interactive Patient  Education  2017 [employer].

## 2016-11-26 NOTE — Progress Notes (Signed)
Chief Complaint  Patient presents with  . Cough    X 3 weeks    HPI  Acute URI Pt has been coughing for 3 weeks She tried otc equate cough and cold that is acetaminophen, dextromorphan and phenylephrine She reports that the cough is nonproductive She feels some chest congestion She denies fever or chills No chest pains She feels like something is stuck in her chest that she is trying to cough up  Elevated blood pressure She denies a history of hypertension She reports that she has been taking an otc sinus medication She denies headaches, cp, palpitations BP Readings from Last 3 Encounters:  11/26/16 (!) 140/100  04/16/16 138/86  03/12/16 128/86    Diabetes She is a diabetic who is well controlled She sees Dr. Mel Almond who is manages her diabetes Her last a1c was 4.5 3 months ago She has been drinking apple juice for hydration    Past Medical History:  Diagnosis Date  . Anemia    takes Iron PO daily  . Diabetes mellitus without complication [facility])     Current Outpatient Prescriptions  Medication Sig Dispense Refill  . Cholecalciferol (VITAMIN D) 2000 UNITS tablet Take 2,000 Units by mouth daily.    Marland Kitchen glipiZIDE-metformin (METAGLIP) 2.5-500 MG per tablet Take 1 tablet by mouth daily.    . IRON PO Take by mouth.      . benzonatate (TESSALON) 100 MG capsule Take 1 capsule (100 mg total) by mouth 2 (two) times daily as needed for cough. 20 capsule 0  . fluticasone (FLONASE) 50 MCG/ACT nasal spray Place 1 spray into both nostrils daily. 16 g 2  . guaiFENesin (MUCINEX) 600 MG 12 hr tablet Take 1 tablet (600 mg total) by mouth 2 (two) times daily.     No current facility-administered medications for this visit.     Allergies: No Known Allergies  Past Surgical History:  Procedure Laterality Date  . ABDOMINAL HYSTERECTOMY N/A 10/22/2014   Procedure: HYSTERECTOMY ABDOMINAL, CYSTOSCOPY;  Surgeon: Anastasio Auerbach, MD;  Location: [facility];  Service: Gynecology;  Laterality:  N/A;  request to follow 7:30am case.  Requested 2 hours OR time for case  . APPENDECTOMY  AGE 60  . COLONOSCOPY  03/02/2011   DR.   Marland Kitchen SALPINGOOPHORECTOMY Bilateral 10/22/2014   Procedure: SALPINGO OOPHORECTOMY;  Surgeon: Anastasio Auerbach, MD;  Location: [facility];  Service: Gynecology;  Laterality: Bilateral;    Social History   Social History  . Marital status: Married    Spouse name: N/A  . Number of children: N/A  . Years of education: N/A   Social History Main Topics  . Smoking status: Never Smoker  . Smokeless tobacco: Never Used  . Alcohol use 0.0 oz/week     Comment: Rare  . Drug use: No  . Sexual activity: Yes    Partners: Male    Birth control/ protection: Condom     Comment: 1st intercourse 56 yo-More than 5 partners   Other Topics Concern  . None   Social History Narrative  . None    ROS Review of Systems See HPI [place]: No fevers or chills No malaise No diaphoresis Skin: No rash or itching Eyes: no blurry vision, no double vision GU: no dysuria or hematuria Neuro: no dizziness or headaches  Objective: Vitals:   11/26/16 1444 11/26/16 1517  BP: (!) 159/111 (!) 140/100  Pulse: 93   Resp: 18   Temp: 98 F (36.7 C)   TempSrc: Oral  SpO2: 97%   Weight: 204 lb 6.4 oz (92.7 kg)   Height: 5' 1.3" (1.557 m)     Physical Exam General: alert, oriented, in NAD Head: normocephalic, atraumatic, no sinus tenderness Eyes: EOM intact, no scleral icterus or conjunctival injection Ears: TM clear bilaterally Nose: mucosa nonerythematous, nonedematous Throat: no pharyngeal exudate or erythema Lymph: no posterior auricular, submental or cervical lymph adenopathy Heart: normal rate, normal sinus rhythm, no murmurs Lungs: clear to auscultation bilaterally, no wheezing   Assessment and Plan Randal was seen today for cough.  Diagnoses and all orders for this visit:  Type 2 diabetes mellitus without complication, without long-term current use of  insulin ([place])- Given her history of diabetes advised otc vitamin c lozenges and avoid drinking juices Continue hydration and diabetes meds   Elevated BP without diagnosis of hypertension- discussed her bp today and advised a recheck either here or at the local pharmacy  Medication side effect- discussed avoiding dextromorphan and phenylephrine  Acute URI- continue supportive care -     fluticasone (FLONASE) 50 MCG/ACT nasal spray; Place 1 spray into both nostrils daily. -     guaiFENesin (MUCINEX) 600 MG 12 hr tablet; Take 1 tablet (600 mg total) by mouth 2 (two) times daily. -     benzonatate (TESSALON) 100 MG capsule; Take 1 capsule (100 mg total) by mouth 2 (two) times daily as needed for cough.     Brent

## 2017-04-19 ENCOUNTER — Encounter: Payer: Self-pay | Admitting: Gynecology

## 2017-04-19 ENCOUNTER — Ambulatory Visit (INDEPENDENT_AMBULATORY_CARE_PROVIDER_SITE_OTHER): Payer: No Typology Code available for payment source | Admitting: Gynecology

## 2017-04-19 VITALS — BP 120/76 | Ht 61.0 in | Wt 206.0 lb

## 2017-04-19 DIAGNOSIS — Z01411 Encounter for gynecological examination (general) (routine) with abnormal findings: Secondary | ICD-10-CM | POA: Diagnosis not present

## 2017-04-19 DIAGNOSIS — N952 Postmenopausal atrophic vaginitis: Secondary | ICD-10-CM | POA: Diagnosis not present

## 2017-04-19 NOTE — Patient Instructions (Signed)
Follow-up in 1 year for annual exam, sooner if any issues. 

## 2017-04-19 NOTE — Progress Notes (Signed)
    Jocelyn Cole 1961/04/13 [id number]        56 y.o.  G1P0101 for annual gynecologic exam.  Doing well without complaints.  Past medical history,surgical history, problem list, medications, allergies, family history and social history were all reviewed and documented as reviewed in the EPIC chart.  ROS:  Performed with pertinent positives and negatives included in the history, assessment and plan.   Additional significant findings : None   Exam: [name] assistant Vitals:   04/19/17 1608  BP: 120/76  Weight: 206 lb (93.4 kg)  Height: 5\' 1"  (1.549 m)   Body mass index is 38.92 kg/m.  General appearance:  Normal affect, orientation and appearance. Skin: Grossly normal HEENT: Without gross lesions.  No cervical or supraclavicular adenopathy. Thyroid normal.  Lungs:  Clear without wheezing, rales or rhonchi Cardiac: RR, without RMG Abdominal:  Soft, nontender, without masses, guarding, rebound, organomegaly or hernia Breasts:  Examined lying and sitting without masses, retractions, discharge or axillary adenopathy. Pelvic:  Ext, BUS, Vagina: With atrophic changes  Adnexa: Without masses or tenderness    Anus and perineum: Normal   Rectovaginal: Normal sphincter tone without palpated masses or tenderness.    Assessment/Plan:  56 y.o. G66P0101 female for annual gynecologic exam status post TAH/BSO for leiomyoma, bleeding and pain.   1. Postmenopausal/atrophic genital changes.  No significant hot flushes, night sweats, vaginal dryness. 2. Mammography yesterday.  Continue with annual mammography next year.  Breast exam normal today.  SBE monthly reviewed. 3. Pap smear/HPV 04/2014.  No Pap smear done today.  No history of significant abnormal Pap smears.  Options to stop screening per current screening guidelines based on hysterectomy history reviewed.  Will readdress on an annual basis. 4. Colonoscopy 2012.  Repeat at their recommended interval. 5. DEXA never.  Will plan  further into the menopause. 6. Health maintenance.  No routine lab work done as patient reports this done elsewhere.  Follow-up 1 year, sooner as needed.   Anastasio Auerbach MD, 4:52 PM 04/19/2017

## 2017-08-27 IMAGING — CR DG CHEST 2V
2 series · 2 of 2 positions shown · non-contrast
Comparison: 07/02/2012.

CLINICAL DATA: 54-year-old female with cough for 1 month,
productive. Fatigue. Initial encounter.

EXAM:
CHEST  2 VIEW

[PA]
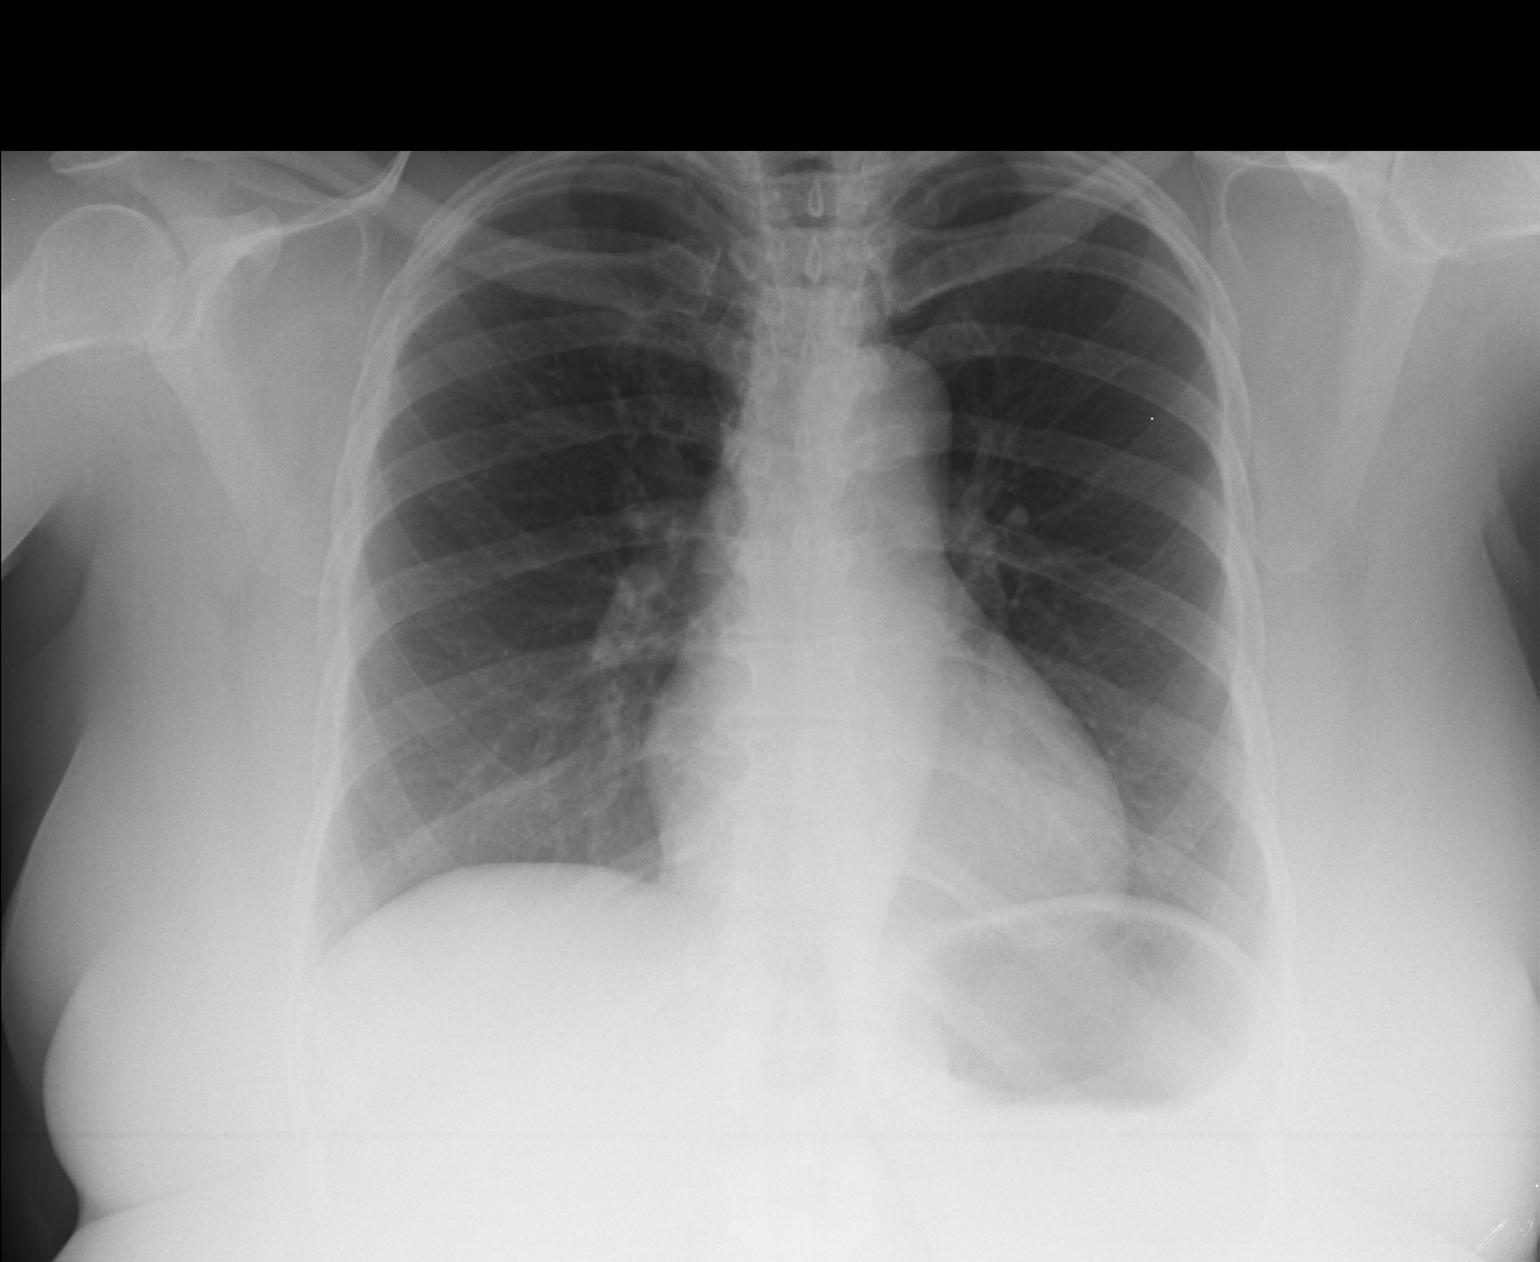

[lateral]
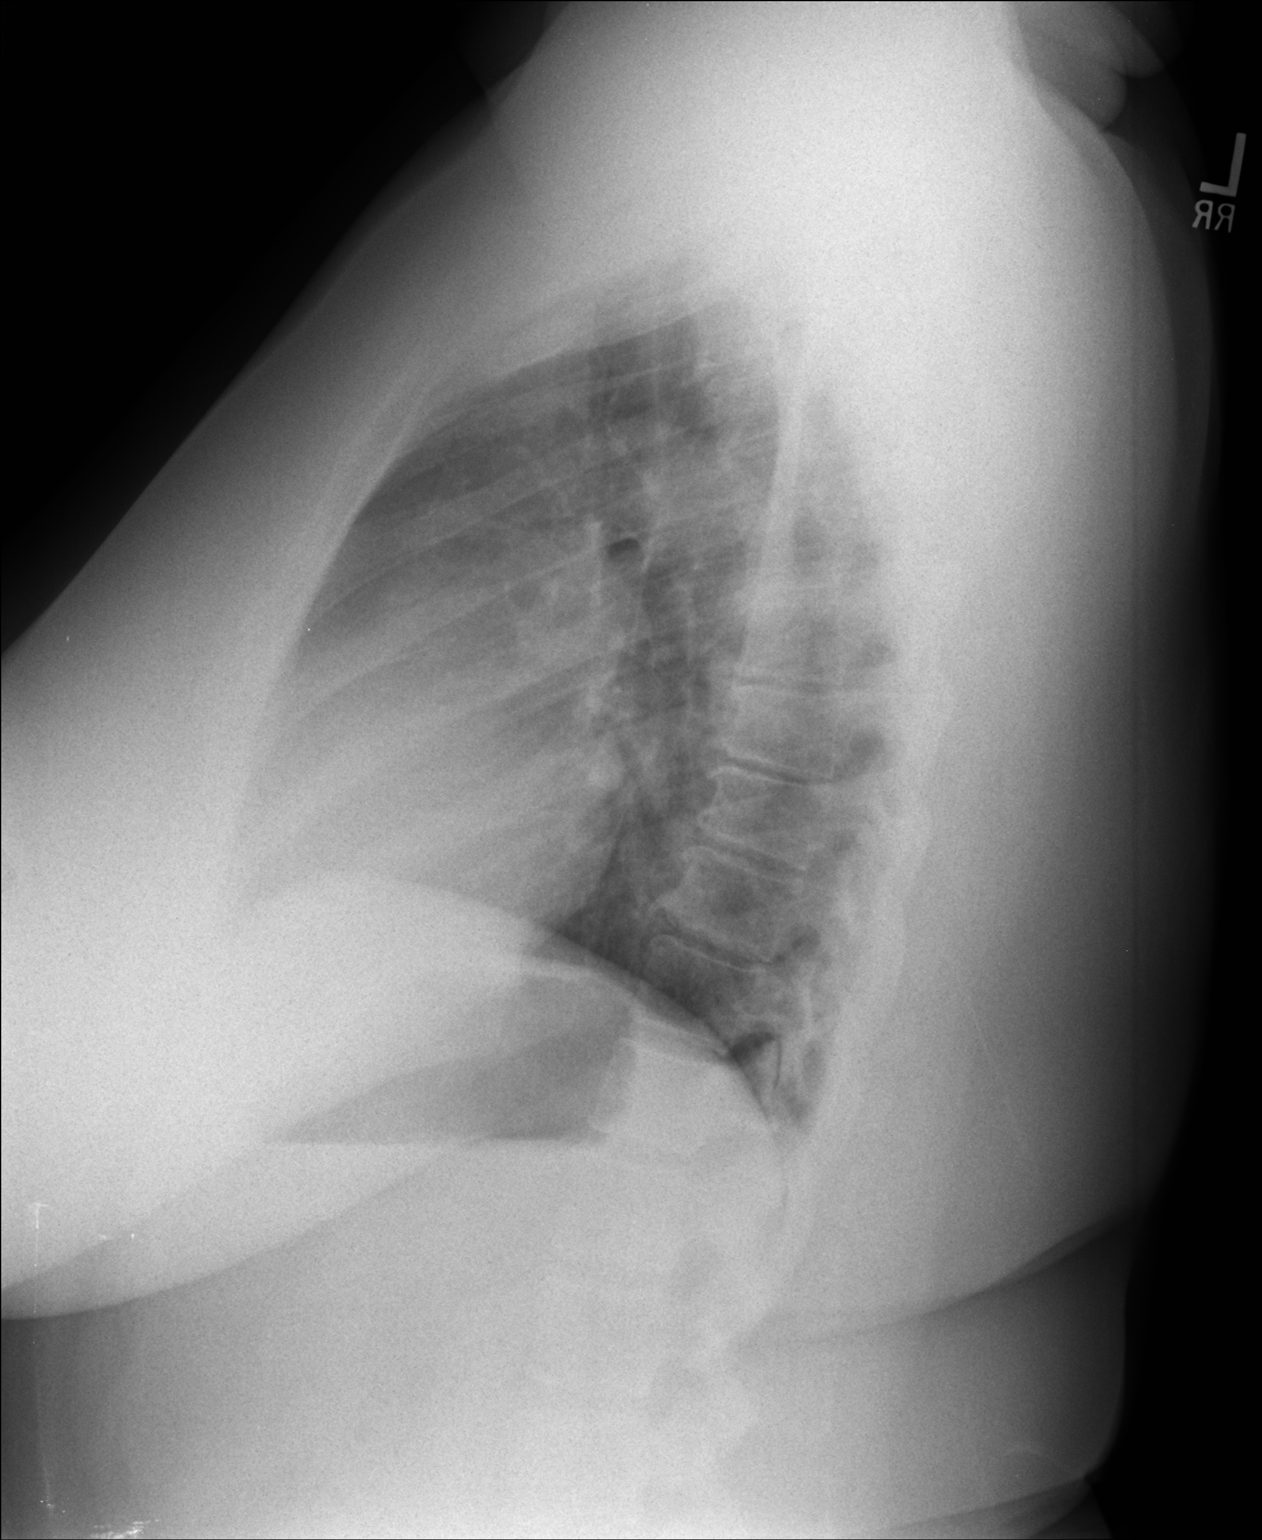

[2 of 2 positions shown; findings below may reference images not displayed]

FINDINGS: Large body habitus. Lung volumes are within normal limits. Normal
cardiac size and mediastinal contours. Visualized tracheal air
column is within normal limits. No pneumothorax, pulmonary edema,
pleural effusion or confluent pulmonary opacity. No acute osseous
abnormality identified. Degenerative endplate changes in the
thoracic spine.
IMPRESSION: Negative, no acute cardiopulmonary abnormality.

## 2018-01-25 ENCOUNTER — Other Ambulatory Visit: Payer: Self-pay

## 2018-01-25 ENCOUNTER — Ambulatory Visit (INDEPENDENT_AMBULATORY_CARE_PROVIDER_SITE_OTHER): Payer: No Typology Code available for payment source | Admitting: Urgent Care

## 2018-01-25 ENCOUNTER — Encounter: Payer: Self-pay | Admitting: Urgent Care

## 2018-01-25 VITALS — BP 140/98 | HR 83 | Temp 98.0°F | Resp 17 | Ht 61.42 in | Wt 205.2 lb

## 2018-01-25 DIAGNOSIS — Z1211 Encounter for screening for malignant neoplasm of colon: Secondary | ICD-10-CM

## 2018-01-25 DIAGNOSIS — I10 Essential (primary) hypertension: Secondary | ICD-10-CM

## 2018-01-25 DIAGNOSIS — E119 Type 2 diabetes mellitus without complications: Secondary | ICD-10-CM

## 2018-01-25 DIAGNOSIS — Z0000 Encounter for general adult medical examination without abnormal findings: Secondary | ICD-10-CM

## 2018-01-25 DIAGNOSIS — R030 Elevated blood-pressure reading, without diagnosis of hypertension: Secondary | ICD-10-CM

## 2018-01-25 MED ORDER — AMLODIPINE BESYLATE 5 MG PO TABS: 5.0000 mg | ORAL_TABLET | Freq: Every day | ORAL | 3 refills | Status: DC

## 2018-01-25 NOTE — Patient Instructions (Addendum)
Dr. Nolon Rod, Dr. Pamella Pert can continue working with you on your healthcare goals.    Call [facility] to set up a repeat colonoscopy. You were due in 2017 according to the report.     If you have lab work done today you will be contacted with your lab results within the next 2 weeks.  If you have not heard from [place] then please contact us. The fastest way to get your results is to register for My Chart.   Health Maintenance, Female Adopting a healthy lifestyle and getting preventive care can go a long way to promote health and wellness. Talk with your health care provider about what schedule of regular examinations is right for you. This is a good chance for you to check in with your provider about disease prevention and staying healthy. In between checkups, there are plenty of things you can do on your own. Experts have done a lot of research about which lifestyle changes and preventive measures are most likely to keep you healthy. Ask your health care provider for more information. Weight and diet Eat a healthy diet  Be sure to include plenty of vegetables, fruits, low-fat dairy products, and lean protein.  Do not eat a lot of foods high in solid fats, added sugars, or salt.  Get regular exercise. This is one of the most important things you can do for your health. ? Most adults should exercise for at least 150 minutes each week. The exercise should increase your heart rate and make you sweat (moderate-intensity exercise). ? Most adults should also do strengthening exercises at least twice a week. This is in addition to the moderate-intensity exercise.  Maintain a healthy weight  Body mass index (BMI) is a measurement that can be used to identify possible weight problems. It estimates body fat based on height and weight. Your health care provider can help determine your BMI and help you achieve or maintain a healthy weight.  For females 46 years of age and older: ? A BMI below 18.5 is  considered underweight. ? A BMI of 18.5 to 24.9 is normal. ? A BMI of 25 to 29.9 is considered overweight. ? A BMI of 30 and above is considered obese.  Watch levels of cholesterol and blood lipids  You should start having your blood tested for lipids and cholesterol at 57 years of age, then have this test every 5 years.  You may need to have your cholesterol levels checked more often if: ? Your lipid or cholesterol levels are high. ? You are older than 56 years of age. ? You are at high risk for heart disease.  Cancer screening Lung Cancer  Lung cancer screening is recommended for adults 81-34 years old who are at high risk for lung cancer because of a history of smoking.  A yearly low-dose CT scan of the lungs is recommended for people who: ? Currently smoke. ? Have quit within the past 15 years. ? Have at least a 30-pack-year history of smoking. A pack year is smoking an average of one pack of cigarettes a day for 1 year.  Yearly screening should continue until it has been 15 years since you quit.  Yearly screening should stop if you develop a health problem that would prevent you from having lung cancer treatment.  Breast Cancer  Practice breast self-awareness. This means understanding how your breasts normally appear and feel.  It also means doing regular breast self-exams. Let your health care provider know about  any changes, no matter how small.  If you are in your 20s or 30s, you should have a clinical breast exam (CBE) by a health care provider every 1-3 years as part of a regular health exam.  If you are 42 or older, have a CBE every year. Also consider having a breast X-ray (mammogram) every year.  If you have a family history of breast cancer, talk to your health care provider about genetic screening.  If you are at high risk for breast cancer, talk to your health care provider about having an MRI and a mammogram every year.  Breast cancer gene (BRCA) assessment  is recommended for women who have family members with BRCA-related cancers. BRCA-related cancers include: ? Breast. ? Ovarian. ? Tubal. ? Peritoneal cancers.  Results of the assessment will determine the need for genetic counseling and BRCA1 and BRCA2 testing.  Cervical Cancer Your health care provider may recommend that you be screened regularly for cancer of the pelvic organs (ovaries, uterus, and vagina). This screening involves a pelvic examination, including checking for microscopic changes to the surface of your cervix (Pap test). You may be encouraged to have this screening done every 3 years, beginning at age 24.  For women ages 88-65, health care providers may recommend pelvic exams and Pap testing every 3 years, or they may recommend the Pap and pelvic exam, combined with testing for human papilloma virus (HPV), every 5 years. Some types of HPV increase your risk of cervical cancer. Testing for HPV may also be done on women of any age with unclear Pap test results.  Other health care providers may not recommend any screening for nonpregnant women who are considered low risk for pelvic cancer and who do not have symptoms. Ask your health care provider if a screening pelvic exam is right for you.  If you have had past treatment for cervical cancer or a condition that could lead to cancer, you need Pap tests and screening for cancer for at least 20 years after your treatment. If Pap tests have been discontinued, your risk factors (such as having a new sexual partner) need to be reassessed to determine if screening should resume. Some women have medical problems that increase the chance of getting cervical cancer. In these cases, your health care provider may recommend more frequent screening and Pap tests.  Colorectal Cancer  This type of cancer can be detected and often prevented.  Routine colorectal cancer screening usually begins at 57 years of age and continues through 57 years of  age.  Your health care provider may recommend screening at an earlier age if you have risk factors for colon cancer.  Your health care provider may also recommend using home test kits to check for hidden blood in the stool.  A small camera at the end of a tube can be used to examine your colon directly (sigmoidoscopy or colonoscopy). This is done to check for the earliest forms of colorectal cancer.  Routine screening usually begins at age 41.  Direct examination of the colon should be repeated every 5-10 years through 57 years of age. However, you may need to be screened more often if early forms of precancerous polyps or small growths are found.  Skin Cancer  Check your skin from head to toe regularly.  Tell your health care provider about any new moles or changes in moles, especially if there is a change in a mole's shape or color.  Also tell your health care provider  if you have a mole that is larger than the size of a pencil eraser.  Always use sunscreen. Apply sunscreen liberally and repeatedly throughout the day.  Protect yourself by wearing long sleeves, pants, a wide-brimmed hat, and sunglasses whenever you are outside.  Heart disease, diabetes, and high blood pressure  High blood pressure causes heart disease and increases the risk of stroke. High blood pressure is more likely to develop in: ? People who have blood pressure in the high end of the normal range (130-139/85-89 mm Hg). ? People who are overweight or obese. ? People who are African American.  If you are 51-70 years of age, have your blood pressure checked every 3-5 years. If you are 32 years of age or older, have your blood pressure checked every year. You should have your blood pressure measured twice-once when you are at a hospital or clinic, and once when you are not at a hospital or clinic. Record the average of the two measurements. To check your blood pressure when you are not at a hospital or clinic, you  can use: ? An automated blood pressure machine at a pharmacy. ? A home blood pressure monitor.  If you are between 52 years and 73 years old, ask your health care provider if you should take aspirin to prevent strokes.  Have regular diabetes screenings. This involves taking a blood sample to check your fasting blood sugar level. ? If you are at a normal weight and have a low risk for diabetes, have this test once every three years after 57 years of age. ? If you are overweight and have a high risk for diabetes, consider being tested at a younger age or more often. Preventing infection Hepatitis B  If you have a higher risk for hepatitis B, you should be screened for this virus. You are considered at high risk for hepatitis B if: ? You were born in a country where hepatitis B is common. Ask your health care provider which countries are considered high risk. ? Your parents were born in a high-risk country, and you have not been immunized against hepatitis B (hepatitis B vaccine). ? You have HIV or AIDS. ? You use needles to inject street drugs. ? You live with someone who has hepatitis B. ? You have had sex with someone who has hepatitis B. ? You get hemodialysis treatment. ? You take certain medicines for conditions, including cancer, organ transplantation, and autoimmune conditions.  Hepatitis C  Blood testing is recommended for: ? Everyone born from [birth year] through 1965. ? Anyone with known risk factors for hepatitis C.  Sexually transmitted infections (STIs)  You should be screened for sexually transmitted infections (STIs) including gonorrhea and chlamydia if: ? You are sexually active and are younger than 57 years of age. ? You are older than 57 years of age and your health care provider tells you that you are at risk for this type of infection. ? Your sexual activity has changed since you were last screened and you are at an increased risk for chlamydia or gonorrhea. Ask your  health care provider if you are at risk.  If you do not have HIV, but are at risk, it may be recommended that you take a prescription medicine daily to prevent HIV infection. This is called pre-exposure prophylaxis (PrEP). You are considered at risk if: ? You are sexually active and do not regularly use condoms or know the HIV status of your partner(s). ? You take drugs  by injection. ? You are sexually active with a partner who has HIV.  Talk with your health care provider about whether you are at high risk of being infected with HIV. If you choose to begin PrEP, you should first be tested for HIV. You should then be tested every 3 months for as long as you are taking PrEP. Pregnancy  If you are premenopausal and you may become pregnant, ask your health care provider about preconception counseling.  If you may become pregnant, take 400 to 800 micrograms (mcg) of folic acid every day.  If you want to prevent pregnancy, talk to your health care provider about birth control (contraception). Osteoporosis and menopause  Osteoporosis is a disease in which the bones lose minerals and strength with aging. This can result in serious bone fractures. Your risk for osteoporosis can be identified using a bone density scan.  If you are 57 years of age or older, or if you are at risk for osteoporosis and fractures, ask your health care provider if you should be screened.  Ask your health care provider whether you should take a calcium or vitamin D supplement to lower your risk for osteoporosis.  Menopause may have certain physical symptoms and risks.  Hormone replacement therapy may reduce some of these symptoms and risks. Talk to your health care provider about whether hormone replacement therapy is right for you. Follow these instructions at home:  Schedule regular health, dental, and eye exams.  Stay current with your immunizations.  Do not use any tobacco products including cigarettes, chewing  tobacco, or electronic cigarettes.  If you are pregnant, do not drink alcohol.  If you are breastfeeding, limit how much and how often you drink alcohol.  Limit alcohol intake to no more than 1 drink per day for nonpregnant women. One drink equals 12 ounces of beer, 5 ounces of wine, or 1 ounces of hard liquor.  Do not use street drugs.  Do not share needles.  Ask your health care provider for help if you need support or information about quitting drugs.  Tell your health care provider if you often feel depressed.  Tell your health care provider if you have ever been abused or do not feel safe at home. This information is not intended to replace advice given to you by your health care provider. Make sure you discuss any questions you have with your health care provider. Document Released: 11/02/2010 Document Revised: 09/25/2015 Document Reviewed: 01/21/2015 Elsevier Interactive Patient Education  2018 [employer].    IF you received an x-ray today, you will receive an invoice from [facility]. Please contact [facility] at [phone] with questions or concerns regarding your invoice.   IF you received labwork today, you will receive an invoice from [facility]. Please contact [facility] at [phone] with questions or concerns regarding your invoice.   Our billing staff will not be able to assist you with questions regarding bills from these companies.  You will be contacted with the lab results as soon as they are available. The fastest way to get your results is to activate your My Chart account. Instructions are located on the last page of this paperwork. If you have not heard from [place] regarding the results in 2 weeks, please contact this office.

## 2018-01-25 NOTE — Progress Notes (Signed)
MRN: [id number]  Subjective:   [name] is a 57 y.o. female presenting for annual physical exam.   reports that she has never smoked. She has never used smokeless tobacco. She reports that she drinks alcohol. She reports that she does not use drugs.   Medical care team includes: PCP: Anastasio Auerbach, MD Vision: Gets yearly eye exams.  Dental: Dental cleanings once every 6 months.  OB/GYN: Pap smear with Dr. Phineas Real, mammogram scheduled for 04/2018.  Specialists: Endocrinology for diabetes management.  Had a colonoscopy done in 2012 with plan for 5-year follow-up.  This was done with Dr. Fuller Plan, patient plans on following up with them.  Health Maintenance: Labs done on 01/13/2018 and had an a1c of 7.3%. All other labs unremarkable including BMI, lipid panel, urine microalbumin.   Jocelyn Cole has a current medication list which includes the following prescription(s): vitamin d, iron, metformin, and glipizide-metformin. She has No Known Allergies.  Jocelyn Cole  has a past medical history of Anemia and Diabetes mellitus without complication ([place]). Also  has a past surgical history that includes Appendectomy (AGE 57); Colonoscopy (03/02/2011); Abdominal hysterectomy (N/A, 10/22/2014); and Salpingoophorectomy (Bilateral, 10/22/2014). Her family history includes Cancer in her father; Colon cancer in her father; Diabetes in her brother, father, mother, and sister; Heart disease in her father; Heart failure in her brother and sister; Hypertension in her brother, brother, brother, mother, sister, sister, and sister; Stomach cancer in her father.  ROS  Objective:   Vitals: BP (!) 140/98 (BP Location: Right Arm, Patient Position: Sitting, Cuff Size: Large)   Pulse 83   Temp 98 F (36.7 C) (Oral)   Resp 17   Ht 5' 1.42" (1.56 m)   Wt 205 lb 3.2 oz (93.1 kg)   LMP 02/11/2014   SpO2 97%   BMI 38.25 kg/m   BP Readings from Last 3 Encounters:  01/25/18 (!) 140/98  04/19/17 120/76    11/26/16 (!) 140/100     Visual Acuity Screening   Right eye Left eye Both eyes  Without correction:     With correction: 20/20 NO VISION IN LEFT EYE PER PT 20/20    Wt Readings from Last 3 Encounters:  01/25/18 205 lb 3.2 oz (93.1 kg)  04/19/17 206 lb (93.4 kg)  11/26/16 204 lb 6.4 oz (92.7 kg)    Physical Exam  Constitutional: She is oriented to person, place, and time. She appears well-developed and well-nourished.  HENT:  Mouth/Throat: Oropharynx is clear and moist.  TM's intact bilaterally, no effusions or erythema. Nasal turbinates pink and moist, nasal passages patent. No sinus tenderness. Oropharynx clear, mucous membranes moist, dentition in good repair.  Eyes: Pupils are equal, round, and reactive to light. Conjunctivae and EOM are normal. Right eye exhibits no discharge. Left eye exhibits no discharge. No scleral icterus.  Neck: Normal range of motion. Neck supple. No JVD present. No thyromegaly present.  Cardiovascular: Normal rate, regular rhythm, normal heart sounds and intact distal pulses. Exam reveals no gallop and no friction rub.  No murmur heard. Pulmonary/Chest: Effort normal and breath sounds normal. No stridor. No respiratory distress. She has no wheezes. She has no rales.  Abdominal: Soft. Bowel sounds are normal. She exhibits no distension and no mass. There is no tenderness. There is no rebound and no guarding.  Musculoskeletal: Normal range of motion. She exhibits no edema or tenderness.  Lymphadenopathy:    She has no cervical adenopathy.  Neurological: She is alert and oriented to person, place,  and time. She has normal reflexes. She displays normal reflexes. Coordination normal.  Skin: Skin is warm and dry. No rash noted. No erythema. No pallor.  Psychiatric: She has a normal mood and affect.   Diabetic Foot Exam - Simple   Simple Foot Form Diabetic Foot exam was performed with the following findings:  Yes 01/25/2018  9:25 AM  Visual Inspection No  deformities, no ulcerations, no other skin breakdown bilaterally:  Yes Sensation Testing Intact to touch and monofilament testing bilaterally:  Yes Pulse Check Posterior Tibialis and Dorsalis pulse intact bilaterally:  Yes Comments     Assessment and Plan :   Type 2 diabetes mellitus without complication, without long-term current use of insulin ([place]) - Plan: HM Diabetes Foot Exam  Elevated blood pressure reading - Plan: TSH, CBC  Essential hypertension  Screen for colon cancer - Plan: Ambulatory referral to Gastroenterology  Annual physical exam  Amlodipine for HTN. Discussed healthy lifestyle, diet, exercise, preventative care, vaccinations, and addressed patient's concerns. Follow up in 4 weeks. Counseled patient on potential for adverse effects with medications prescribed today, patient verbalized understanding. Return-to-clinic precautions discussed, patient verbalized understanding.   Jaynee Eagles, PA-C [facility] [phone] 01/25/2018  9:41 AM

## 2018-01-26 LAB — CBC
Hematocrit: 39.5 % (ref 34.0–46.6)
Hemoglobin: 12.7 g/dL (ref 11.1–15.9)
MCH: 23.9 pg — ABNORMAL LOW (ref 26.6–33.0)
MCHC: 32.2 g/dL (ref 31.5–35.7)
MCV: 74 fL — ABNORMAL LOW (ref 79–97)
PLATELETS: 278 10*3/uL (ref 150–450)
RBC: 5.31 x10E6/uL — ABNORMAL HIGH (ref 3.77–5.28)
RDW: 14.8 % (ref 12.3–15.4)
WBC: 6.4 10*3/uL (ref 3.4–10.8)

## 2018-01-26 LAB — TSH: TSH: 1.91 u[IU]/mL (ref 0.450–4.500)

## 2018-01-31 ENCOUNTER — Encounter: Payer: Self-pay | Admitting: Gastroenterology

## 2018-03-08 ENCOUNTER — Ambulatory Visit (INDEPENDENT_AMBULATORY_CARE_PROVIDER_SITE_OTHER): Payer: No Typology Code available for payment source | Admitting: Family Medicine

## 2018-03-08 ENCOUNTER — Other Ambulatory Visit: Payer: Self-pay

## 2018-03-08 ENCOUNTER — Encounter: Payer: Self-pay | Admitting: Family Medicine

## 2018-03-08 VITALS — BP 132/86 | HR 80 | Temp 97.9°F | Resp 16 | Ht 61.42 in | Wt 202.0 lb

## 2018-03-08 DIAGNOSIS — Z5329 Procedure and treatment not carried out because of patient's decision for other reasons: Secondary | ICD-10-CM

## 2018-03-08 NOTE — Progress Notes (Signed)
No show

## 2018-03-08 NOTE — Patient Instructions (Signed)
° ° ° °  If you have lab work done today you will be contacted with your lab results within the next 2 weeks.  If you have not heard from us then please contact us. The fastest way to get your results is to register for My Chart. ° ° °IF you received an x-ray today, you will receive an invoice from [facility]. Please contact [facility] at [phone] with questions or concerns regarding your invoice.  ° °IF you received labwork today, you will receive an invoice from [facility]. Please contact [facility] at [phone] with questions or concerns regarding your invoice.  ° °Our billing staff will not be able to assist you with questions regarding bills from these companies. ° °You will be contacted with the lab results as soon as they are available. The fastest way to get your results is to activate your My Chart account. Instructions are located on the last page of this paperwork. If you have not heard from us regarding the results in 2 weeks, please contact this office. °  ° ° ° °

## 2018-03-17 ENCOUNTER — Ambulatory Visit (AMBULATORY_SURGERY_CENTER): Payer: PRIVATE HEALTH INSURANCE | Admitting: *Deleted

## 2018-03-17 ENCOUNTER — Telehealth: Payer: Self-pay | Admitting: *Deleted

## 2018-03-17 VITALS — Ht 61.0 in | Wt 203.0 lb

## 2018-03-17 DIAGNOSIS — Z1211 Encounter for screening for malignant neoplasm of colon: Secondary | ICD-10-CM

## 2018-03-17 NOTE — Progress Notes (Signed)
Patient came into [facility] today questioning the need for the colonoscopy at this time. She wants this to be a "routine screening" so the insurance will pay. I explained to her that on last colon report and in her chart it states that her father had colon cancer. Patient DENIES this! She states that her father had "stomach cancer only", she said he was never treated or dx with colon cancer and that the stomach caner did not MET to colon, per pt. Patient denies any Family members with colon cancer. Note sent to Dr.Stark at this time.

## 2018-03-17 NOTE — Telephone Encounter (Signed)
Patient came in today for recall colon. Her last colon was 03/02/11, TIC's and Hems. Repeat in 5 years due to [facility] in father. When patient came into PV today she denies ANY family hx colon cancer. She states her father was treated and died from "Stomach cancer at age 57", she states he never was dx with colon cancer. She denies any family members with colon cancer. I explained that it is in her chart that her father had colon cancer and that is why her colon was due. Patient denies any GI problems or concerns at this time. When should she repeat colonoscopy? Please advise. Thank you,Deysha Cartier pv

## 2018-03-19 NOTE — Telephone Encounter (Signed)
Please correct/update her family history in Epic as needed by information provided that there is no family history of colon cancer.  If she does not have colorectal symptoms then she would be average risk for CRC cancer and a 10 year interval for her next screening colonoscopy would be appropriate.

## 2018-03-20 NOTE — Telephone Encounter (Signed)
Patient notified of Dr.Stark's recommendations. So patient notified her colonoscopy will be due 2022. New recall colon placed for 2022 in epic # [phone]. Patient denies any GI symptoms or problems at this time. Encouraged patient to call us back with any changes to medical hx or any GI concerns. Pt understands. Colon cancelled.

## 2018-03-27 ENCOUNTER — Encounter: Payer: PRIVATE HEALTH INSURANCE | Admitting: Gastroenterology

## 2018-04-20 ENCOUNTER — Ambulatory Visit (INDEPENDENT_AMBULATORY_CARE_PROVIDER_SITE_OTHER): Payer: No Typology Code available for payment source | Admitting: Gynecology

## 2018-04-20 ENCOUNTER — Encounter: Payer: Self-pay | Admitting: Gynecology

## 2018-04-20 VITALS — BP 130/78 | Ht 61.5 in | Wt 202.0 lb

## 2018-04-20 DIAGNOSIS — N952 Postmenopausal atrophic vaginitis: Secondary | ICD-10-CM

## 2018-04-20 DIAGNOSIS — Z01411 Encounter for gynecological examination (general) (routine) with abnormal findings: Secondary | ICD-10-CM | POA: Diagnosis not present

## 2018-04-20 DIAGNOSIS — Z1151 Encounter for screening for human papillomavirus (HPV): Secondary | ICD-10-CM

## 2018-04-20 DIAGNOSIS — Z01419 Encounter for gynecological examination (general) (routine) without abnormal findings: Secondary | ICD-10-CM

## 2018-04-20 NOTE — Patient Instructions (Signed)
Follow-up in 1 year for annual exam, sooner if any issues. 

## 2018-04-20 NOTE — Progress Notes (Signed)
    TAMMI BOULIER October 23, 1960 [id number]        57 y.o.  G1P0101 for annual gynecologic exam.  Without gynecologic complaints  Past medical history,surgical history, problem list, medications, allergies, family history and social history were all reviewed and documented as reviewed in the EPIC chart.  ROS:  Performed with pertinent positives and negatives included in the history, assessment and plan.   Additional significant findings : None   Exam: [name] assistant Vitals:   04/20/18 1552  BP: 130/78  Weight: 202 lb (91.6 kg)  Height: 5' 1.5" (1.562 m)   Body mass index is 37.55 kg/m.  General appearance:  Normal affect, orientation and appearance. Skin: Grossly normal HEENT: Without gross lesions.  No cervical or supraclavicular adenopathy. Thyroid normal.  Lungs:  Clear without wheezing, rales or rhonchi Cardiac: RR, without RMG Abdominal:  Soft, nontender, without masses, guarding, rebound, organomegaly or hernia Breasts:  Examined lying and sitting without masses, retractions, discharge or axillary adenopathy. Pelvic:  Ext, BUS, Vagina: Normal with atrophic changes.  Pap smear/HPV of vaginal cuff  Adnexa: Without masses or tenderness    Anus and perineum: Normal   Rectovaginal: Normal sphincter tone without palpated masses or tenderness.    Assessment/Plan:  57 y.o. G60P0101 female for annual gynecologic exam status post TAH/BSO for leiomyoma, pain and bleeding.   1. Postmenopausal.  No significant menopausal symptoms. 2. Mammography yesterday.  Continue with annual mammography next year.  Breast exam normal today. 3. Pap smear/HPV 2015.  Pap smear/HPV today.  No history of abnormal Pap smears.  Options to stop screening based on hysterectomy history and current screening guidelines reviewed.  Will readdress on an annual basis. 4. Colonoscopy 2012.  Repeat at their recommended interval. 5. DEXA never.  Will plan at age 74. 51. Health maintenance.  No routine lab  work done as patient reports this done elsewhere.  Follow-up 1 year, sooner as needed.   Anastasio Auerbach MD, 4:32 PM 04/20/2018

## 2018-04-20 NOTE — Addendum Note (Signed)
Addended by: Nelva Nay on: 04/20/2018 04:58 PM   Modules accepted: Orders

## 2018-04-21 LAB — PAP IG AND HPV HIGH-RISK: HPV DNA HIGH RISK: NOT DETECTED

## 2018-05-26 ENCOUNTER — Encounter: Payer: Self-pay | Admitting: Osteopathic Medicine

## 2018-05-26 ENCOUNTER — Ambulatory Visit (INDEPENDENT_AMBULATORY_CARE_PROVIDER_SITE_OTHER): Payer: No Typology Code available for payment source | Admitting: Osteopathic Medicine

## 2018-05-26 ENCOUNTER — Other Ambulatory Visit: Payer: Self-pay

## 2018-05-26 VITALS — BP 120/80 | HR 98 | Temp 98.4°F | Resp 17 | Ht 61.5 in | Wt 197.4 lb

## 2018-05-26 DIAGNOSIS — N898 Other specified noninflammatory disorders of vagina: Secondary | ICD-10-CM | POA: Diagnosis not present

## 2018-05-26 LAB — POCT WET + KOH PREP
Trich by wet prep: ABSENT
YEAST BY KOH: ABSENT
Yeast by wet prep: ABSENT

## 2018-05-26 MED ORDER — METRONIDAZOLE 500 MG PO TABS: 500.0000 mg | ORAL_TABLET | Freq: Two times a day (BID) | ORAL | 0 refills | Status: AC

## 2018-05-26 MED ORDER — FLUCONAZOLE 150 MG PO TABS: 150.0000 mg | ORAL_TABLET | Freq: Once | ORAL | 1 refills | Status: AC

## 2018-05-26 NOTE — Patient Instructions (Addendum)
  Sent treatment to pharmacy for yeast infection and bacterial vaginosis If no better or if worse/change, call or come see [place]! Would toss all old razors, no new OTC meds or lotions to vaginal area.    If you have lab work done today you will be contacted with your lab results within the next 2 weeks.  If you have not heard from [place] then please contact us. The fastest way to get your results is to register for My Chart.   IF you received an x-ray today, you will receive an invoice from [facility]. Please contact [facility] at [phone] with questions or concerns regarding your invoice.   IF you received labwork today, you will receive an invoice from [place]. Please contact [facility] at [phone] with questions or concerns regarding your invoice.   Our billing staff will not be able to assist you with questions regarding bills from these companies.  You will be contacted with the lab results as soon as they are available. The fastest way to get your results is to activate your My Chart account. Instructions are located on the last page of this paperwork. If you have not heard from [place] regarding the results in 2 weeks, please contact this office.

## 2018-05-26 NOTE — Progress Notes (Signed)
HPI: Jocelyn Cole is a 58 y.o. female who  has a past medical history of Anemia, Diabetes mellitus without complication ([place]), and Hypertension.  she presents to [facility] today, 05/26/18,  for chief complaint of:  Chief Complaint  Patient presents with  . Vaginal Itching    x 2 weeks, tried otc anti itch cream and caused swelling and redness to the labia, no discharge or odor per pt but very itchy     . Location/Quality: itching vaginal area, no discharge or odor . Duration: 2 weeks . Modifying factors: swelling to skin w/ OTC anti-itching cream w/ Benzocaine. Recently shaved before this happened.         At today's visit 05/26/18 ... PMH, PSH, FH reviewed and updated as needed.  Current medication list and allergy/intolerance hx reviewed and updated as needed. (See remainder of HPI, ROS, Phys Exam below)    No results found.  Results for orders placed or performed in visit on 05/26/18 (from the past 72 hour(s))  POCT Wet + KOH Prep     Status: Abnormal   Collection Time: 05/26/18  3:10 PM  Result Value Ref Range   Yeast by KOH Absent Absent   Yeast by wet prep Absent Absent   WBC by wet prep Few Few   Clue Cells Wet Prep HPF POC Few (A) None   Trich by wet prep Absent Absent   Bacteria Wet Prep HPF POC Few Few   Epithelial Cells By [employer] Pref ([facility]) Many (A) None, Few, Too numerous to count   RBC,UR,HPF,POC None None RBC/hpf          ASSESSMENT/PLAN: The encounter diagnosis was Vaginal itching.   Will treat for BV and yeast, pt declined exam today, ill RTC for exam and further cultures if worse. Monogamous w/ husband no new potential STI exposure she is aware of.     Orders Placed This Encounter  Procedures  . POCT Wet + KOH Prep     Meds ordered this encounter  Medications  . metroNIDAZOLE (FLAGYL) 500 MG tablet    Sig: Take 1 tablet (500 mg total) by mouth 2 (two) times daily for 7 days.    Dispense:  14  tablet    Refill:  0  . fluconazole (DIFLUCAN) 150 MG tablet    Sig: Take 1 tablet (150 mg total) by mouth once for 1 dose. Repeat dose 72 hours if yeast infection persists    Dispense:  2 tablet    Refill:  1    Patient Instructions    Sent treatment to pharmacy for yeast infection and bacterial vaginosis If no better or if worse/change, call or come see [place]! Would toss all old razors, no new OTC meds or lotions to vaginal area.    If you have lab work done today you will be contacted with your lab results within the next 2 weeks.  If you have not heard from [place] then please contact us. The fastest way to get your results is to register for My Chart.   IF you received an x-ray today, you will receive an invoice from [facility]. Please contact [facility] at [phone] with questions or concerns regarding your invoice.   IF you received labwork today, you will receive an invoice from [place]. Please contact [facility] at [phone] with questions or concerns regarding your invoice.   Our billing staff will not be able to assist you with questions regarding bills from these  companies.  You will be contacted with the lab results as soon as they are available. The fastest way to get your results is to activate your My Chart account. Instructions are located on the last page of this paperwork. If you have not heard from [place] regarding the results in 2 weeks, please contact this office.         Follow-up plan: Return if symptoms worsen or fail to improve.                                                 ################################################# ################################################# ################################################# #################################################    Current Meds  Medication Sig  . amLODipine (NORVASC) 5 MG tablet Take 1 tablet (5 mg total) by mouth daily.  .  Cholecalciferol (VITAMIN D) 2000 UNITS tablet Take 2,000 Units by mouth daily.  . IRON PO Take by mouth.    . metFORMIN (GLUCOPHAGE) 500 MG tablet Take by mouth 2 (two) times daily with a meal.    No Known Allergies     Review of Systems:  Constitutional: No recent illness, no fever  Respiratory:  No  shortness of breath. No  Cough  Gastrointestinal: No  abdominal pain  GU: no urinary frequency or dysuria  Musculoskeletal: No new myalgia/arthralgia  Skin: No  Rash, +itching as per HPI  Hem/Onc: No  easy bruising/bleeding, No  abnormal lumps/bumps   Exam:  BP 120/80 (BP Location: Left Arm, Patient Position: Sitting, Cuff Size: Large)   Pulse 98   Temp 98.4 F (36.9 C) (Oral)   Resp 17   Ht 5' 1.5" (1.562 m)   Wt 197 lb 6.4 oz (89.5 kg)   LMP 02/11/2014   SpO2 95%   BMI 36.69 kg/m   Constitutional: VS see above. General Appearance: alert, well-developed, well-nourished, NAD  Eyes: Normal lids and conjunctive, non-icteric sclera  Ears, Nose, Mouth, Throat: MMM, Normal external inspection ears/nares/mouth/lips/gums.  Neck: No masses, trachea midline.   Respiratory: Normal respiratory effort.   Musculoskeletal: Gait normal. Symmetric and independent movement of all extremities  Neurological: Normal balance/coordination. No tremor.  Skin: warm, dry, intact.   Psychiatric: Normal judgment/insight. Normal mood and affect. Oriented x3.       Visit summary with medication list and pertinent instructions was printed for patient to review, patient was advised to alert [place] if any updates are needed. All questions at time of visit were answered - patient instructed to contact office with any additional concerns. ER/RTC precautions were reviewed with the patient and understanding verbalized.     Please note: voice recognition software was used to produce this document, and typos may escape review. Please contact Dr. Sheppard Coil for any needed clarifications.     Follow up plan: Return if symptoms worsen or fail to improve.

## 2019-01-29 ENCOUNTER — Encounter: Payer: Self-pay | Admitting: Family Medicine

## 2019-01-29 ENCOUNTER — Other Ambulatory Visit: Payer: Self-pay

## 2019-01-29 ENCOUNTER — Ambulatory Visit (INDEPENDENT_AMBULATORY_CARE_PROVIDER_SITE_OTHER): Payer: No Typology Code available for payment source | Admitting: Family Medicine

## 2019-01-29 VITALS — BP 148/90 | HR 88 | Temp 98.6°F | Ht 61.5 in | Wt 194.4 lb

## 2019-01-29 DIAGNOSIS — I10 Essential (primary) hypertension: Secondary | ICD-10-CM | POA: Diagnosis not present

## 2019-01-29 DIAGNOSIS — Z0001 Encounter for general adult medical examination with abnormal findings: Secondary | ICD-10-CM | POA: Diagnosis not present

## 2019-01-29 DIAGNOSIS — Z0000 Encounter for general adult medical examination without abnormal findings: Secondary | ICD-10-CM

## 2019-01-29 MED ORDER — AMLODIPINE BESYLATE 5 MG PO TABS: 5.0000 mg | ORAL_TABLET | Freq: Every day | ORAL | 0 refills | Status: DC

## 2019-01-29 NOTE — Patient Instructions (Addendum)
NURSE BP CHECK IN 2 WEEKS. NOTIFY ME OF READING PRIOR TO LETTING PATIENT LEAVE.   Preventive Care 28-58 Years Old, Female Preventive care refers to visits with your health care provider and lifestyle choices that can promote health and wellness. This includes:  A yearly physical exam. This may also be called an annual well check.  Regular dental visits and eye exams.  Immunizations.  Screening for certain conditions.  Healthy lifestyle choices, such as eating a healthy diet, getting regular exercise, not using drugs or products that contain nicotine and tobacco, and limiting alcohol use. What can I expect for my preventive care visit? Physical exam Your health care provider will check your:  Height and weight. This may be used to calculate body mass index (BMI), which tells if you are at a healthy weight.  Heart rate and blood pressure.  Skin for abnormal spots. Counseling Your health care provider may ask you questions about your:  Alcohol, tobacco, and drug use.  Emotional well-being.  Home and relationship well-being.  Sexual activity.  Eating habits.  Work and work Statistician.  Method of birth control.  Menstrual cycle.  Pregnancy history. What immunizations do I need?  Influenza (flu) vaccine  This is recommended every year. Tetanus, diphtheria, and pertussis (Tdap) vaccine  You may need a Td booster every 10 years. Varicella (chickenpox) vaccine  You may need this if you have not been vaccinated. Zoster (shingles) vaccine  You may need this after age 31. Measles, mumps, and rubella (MMR) vaccine  You may need at least one dose of MMR if you were born in 1957 or later. You may also need a second dose. Pneumococcal conjugate (PCV13) vaccine  You may need this if you have certain conditions and were not previously vaccinated. Pneumococcal polysaccharide (PPSV23) vaccine  You may need one or two doses if you smoke cigarettes or if you have certain  conditions. Meningococcal conjugate (MenACWY) vaccine  You may need this if you have certain conditions. Hepatitis A vaccine  You may need this if you have certain conditions or if you travel or work in places where you may be exposed to hepatitis A. Hepatitis B vaccine  You may need this if you have certain conditions or if you travel or work in places where you may be exposed to hepatitis B. Haemophilus influenzae type b (Hib) vaccine  You may need this if you have certain conditions. Human papillomavirus (HPV) vaccine  If recommended by your health care provider, you may need three doses over 6 months. You may receive vaccines as individual doses or as more than one vaccine together in one shot (combination vaccines). Talk with your health care provider about the risks and benefits of combination vaccines. What tests do I need? Blood tests  Lipid and cholesterol levels. These may be checked every 5 years, or more frequently if you are over 56 years old.  Hepatitis C test.  Hepatitis B test. Screening  Lung cancer screening. You may have this screening every year starting at age 78 if you have a 30-pack-year history of smoking and currently smoke or have quit within the past 15 years.  Colorectal cancer screening. All adults should have this screening starting at age 81 and continuing until age 41. Your health care provider may recommend screening at age 55 if you are at increased risk. You will have tests every 1-10 years, depending on your results and the type of screening test.  Diabetes screening. This is done by checking  your blood sugar (glucose) after you have not eaten for a while (fasting). You may have this done every 1-3 years.  Mammogram. This may be done every 1-2 years. Talk with your health care provider about when you should start having regular mammograms. This may depend on whether you have a family history of breast cancer.  BRCA-related cancer screening. This  may be done if you have a family history of breast, ovarian, tubal, or peritoneal cancers.  Pelvic exam and Pap test. This may be done every 3 years starting at age 51. Starting at age 49, this may be done every 5 years if you have a Pap test in combination with an HPV test. Other tests  Sexually transmitted disease (STD) testing.  Bone density scan. This is done to screen for osteoporosis. You may have this scan if you are at high risk for osteoporosis. Follow these instructions at home: Eating and drinking  Eat a diet that includes fresh fruits and vegetables, whole grains, lean protein, and low-fat dairy.  Take vitamin and mineral supplements as recommended by your health care provider.  Do not drink alcohol if: ? Your health care provider tells you not to drink. ? You are pregnant, may be pregnant, or are planning to become pregnant.  If you drink alcohol: ? Limit how much you have to 0-1 drink a day. ? Be aware of how much alcohol is in your drink. In the [place] one drink equals one 12 oz bottle of beer (355 mL), one 5 oz glass of wine (148 mL), or one 1 oz glass of hard liquor (44 mL). Lifestyle  Take daily care of your teeth and gums.  Stay active. Exercise for at least 30 minutes on 5 or more days each week.  Do not use any products that contain nicotine or tobacco, such as cigarettes, e-cigarettes, and chewing tobacco. If you need help quitting, ask your health care provider.  If you are sexually active, practice safe sex. Use a condom or other form of birth control (contraception) in order to prevent pregnancy and STIs (sexually transmitted infections).  If told by your health care provider, take low-dose aspirin daily starting at age 39. What's next?  Visit your health care provider once a year for a well check visit.  Ask your health care provider how often you should have your eyes and teeth checked.  Stay up to date on all vaccines. This information is not  intended to replace advice given to you by your health care provider. Make sure you discuss any questions you have with your health care provider. Document Released: 05/16/2015 Document Revised: 12/29/2017 Document Reviewed: 12/29/2017 Elsevier Patient Education  2020 [employer].

## 2019-01-29 NOTE — Progress Notes (Signed)
9/28/20201:50 PM  Jocelyn Cole 03/13/1961, 58 y.o., female [id number]  Chief Complaint  Patient presents with  . Annual Exam    labs were done with endo, has consent to get records. Says ins does not cover vision screeing even though it is apart of cpe. pt is very anxious bp will be taken after visit    HPI:   Patient is a 58 y.o. female with past medical history significant for DM2, HTN who presents today for CPE  Cervical Cancer Screening: Dr Modena Morrow, Dec 2019 Breast Cancer Screening:  Dec 2019 Colorectal Cancer Screening: Dr Fuller Plan, colonoscopy due 2022 Bone Density Testing: at age 65 HIV Screening: 2017 STI Screening: 2020 Seasonal Influenza Vaccination: declines Td/Tdap Vaccination: declines Pneumococcal Vaccination: declines Zoster Vaccination: declines Frequency of Dental evaluation: Q6 months, last appt aug Frequency of Eye evaluation: wears eyeglasses, yearly, [facility]etta [facility], last appt March 2020, per patient no retinopathy  Sees Dr Chalmers Cater, endo Had labs aug 2020  BMP normal, crt 0.7 a1c 6.0 Lipids normal, LDL 119 BP at goal  Depression screen [facility] 2/9 01/29/2019 05/26/2018 03/08/2018  Decreased Interest 0 0 0  Down, Depressed, Hopeless 0 0 0  PHQ - 2 Score 0 0 0    Fall Risk  01/29/2019 05/26/2018 03/08/2018 01/25/2018 11/26/2016  Falls in the past year? 0 0 0 No No  Number falls in past yr: 0 - - - -  Injury with Fall? 0 - - - -     No Known Allergies  Prior to Admission medications   Medication Sig Start Date End Date Taking? Authorizing Provider  amLODipine (NORVASC) 5 MG tablet Take 1 tablet (5 mg total) by mouth daily. 01/25/18  Yes Jaynee Eagles, PA-C  Cholecalciferol (VITAMIN D) 2000 UNITS tablet Take 2,000 Units by mouth daily.   Yes [provider]  IRON PO Take by mouth.     Yes [provider]  metFORMIN (GLUCOPHAGE) 500 MG tablet Take by mouth 2 (two) times daily with a meal.   Yes [provider]    Past  Medical History:  Diagnosis Date  . Anemia    takes Iron PO daily  . Diabetes mellitus without complication ([place])   . Hypertension     Past Surgical History:  Procedure Laterality Date  . ABDOMINAL HYSTERECTOMY N/A 10/22/2014   Procedure: HYSTERECTOMY ABDOMINAL, CYSTOSCOPY;  Surgeon: Anastasio Auerbach, MD;  Location: [facility];  Service: Gynecology;  Laterality: N/A;  request to follow 7:30am case.  Requested 2 hours OR time for case  . APPENDECTOMY  AGE 3  . COLONOSCOPY  03/02/2011   DR. Fuller Plan  . SALPINGOOPHORECTOMY Bilateral 10/22/2014   Procedure: SALPINGO OOPHORECTOMY;  Surgeon: Anastasio Auerbach, MD;  Location: [facility];  Service: Gynecology;  Laterality: Bilateral;    Social History   Tobacco Use  . Smoking status: Never Smoker  . Smokeless tobacco: Never Used  Substance Use Topics  . Alcohol use: Yes    Alcohol/week: 0.0 standard drinks    Comment: Rare    Family History  Problem Relation Age of Onset  . Diabetes Father   . Heart disease Father   . Stomach cancer Father   . Hypertension Sister   . Heart failure Sister   . Hypertension Brother   . Diabetes Brother   . Heart failure Brother   . Hypertension Mother   . Diabetes Mother   . Dementia Mother   . Hypertension Brother   . Hypertension Brother   .  Diabetes Sister   . Hypertension Sister   . Hypertension Sister   . Colon cancer Neg Hx     Review of Systems  Constitutional: Negative for chills and fever.  Respiratory: Negative for cough and shortness of breath.   Cardiovascular: Negative for chest pain, palpitations and leg swelling.  Gastrointestinal: Negative for abdominal pain, nausea and vomiting.     OBJECTIVE:  Today's Vitals   01/29/19 1342 01/29/19 1422  BP: (!) 161/94 (!) 148/90  Pulse: 88   Temp: 98.6 F (37 C)   SpO2: 100%   Weight: 194 lb 6.4 oz (88.2 kg)   Height: 5' 1.5" (1.562 m)    Body mass index is 36.14 kg/m.  BP Readings from Last 3 Encounters:  01/29/19 (!)  161/94  05/26/18 120/80  04/20/18 130/78   Wt Readings from Last 3 Encounters:  01/29/19 194 lb 6.4 oz (88.2 kg)  05/26/18 197 lb 6.4 oz (89.5 kg)  04/20/18 202 lb (91.6 kg)    Physical Exam Vitals signs and nursing note reviewed.  Constitutional:      Appearance: She is well-developed.  HENT:     Head: Normocephalic and atraumatic.     Right Ear: Hearing, tympanic membrane, ear canal and external ear normal.     Left Ear: Hearing, tympanic membrane, ear canal and external ear normal.     Mouth/Throat:     Mouth: Mucous membranes are moist.     Pharynx: No oropharyngeal exudate or posterior oropharyngeal erythema.  Eyes:     Extraocular Movements: Extraocular movements intact.     Conjunctiva/sclera: Conjunctivae normal.     Pupils: Pupils are equal, round, and reactive to light.  Neck:     Musculoskeletal: Neck supple.     Thyroid: No thyromegaly.  Cardiovascular:     Rate and Rhythm: Normal rate and regular rhythm.     Heart sounds: Normal heart sounds. No murmur. No friction rub. No gallop.   Pulmonary:     Effort: Pulmonary effort is normal.     Breath sounds: Normal breath sounds. No wheezing, rhonchi or rales.  Abdominal:     General: Bowel sounds are normal. There is no distension.     Palpations: Abdomen is soft. There is no hepatomegaly, splenomegaly or mass.     Tenderness: There is no abdominal tenderness.  Musculoskeletal: Normal range of motion.     Right lower leg: No edema.     Left lower leg: No edema.  Lymphadenopathy:     Cervical: No cervical adenopathy.  Skin:    General: Skin is warm and dry.  Neurological:     Mental Status: She is alert and oriented to person, place, and time.     Cranial Nerves: No cranial nerve deficit.     Gait: Gait normal.     Deep Tendon Reflexes: Reflexes are normal and symmetric.  Psychiatric:        Mood and Affect: Mood normal.        Behavior: Behavior normal.     No results found for this or any previous visit  (from the past 24 hour(s)).  No results found.   ASSESSMENT and PLAN  1. Annual physical exam HCM reviewed/discussed. Anticipatory guidance regarding healthy weight, lifestyle and choices given.   2. Essential hypertension Above goal. Previously normal. Patient a bit nervous. Recheck BP with nurse visit in 2 weeks, meds to be adjusted then if needed.  - Care order/instruction:  Other orders - SitaGLIPtin-MetFORMIN HCl (JANUMET XR)  50-1000 MG TB24; Take 1 tablet by mouth 2 (two) times daily.  Return in about 1 year (around 01/29/2020) for CPE.    Rutherford Guys, MD [facility] at [place], [place] [address] Ph.  [phone] Fax [phone]

## 2019-01-30 ENCOUNTER — Encounter: Payer: Self-pay | Admitting: Gynecology

## 2019-02-22 ENCOUNTER — Encounter: Payer: Self-pay | Admitting: Family Medicine

## 2019-02-22 ENCOUNTER — Other Ambulatory Visit: Payer: Self-pay

## 2019-02-22 ENCOUNTER — Ambulatory Visit (INDEPENDENT_AMBULATORY_CARE_PROVIDER_SITE_OTHER): Payer: No Typology Code available for payment source | Admitting: Family Medicine

## 2019-02-22 VITALS — BP 160/100

## 2019-02-22 DIAGNOSIS — I10 Essential (primary) hypertension: Secondary | ICD-10-CM | POA: Insufficient documentation

## 2019-02-22 MED ORDER — AMLODIPINE BESYLATE 10 MG PO TABS: 10.0000 mg | ORAL_TABLET | Freq: Every day | ORAL | 0 refills | Status: DC

## 2019-02-22 NOTE — Patient Instructions (Signed)
Spoke with provider and she advised the pt that she will increase her B/P medication and would like her to follow-up in one month.  She verbalized understanding.

## 2019-02-22 NOTE — Progress Notes (Signed)
Nurse BP check 160/100 - manual Increased amlodipine to 10mg  daily Home BP monitoring followup in 4 weeks with provider, in office

## 2019-03-26 ENCOUNTER — Ambulatory Visit: Payer: No Typology Code available for payment source | Admitting: Family Medicine

## 2019-03-27 ENCOUNTER — Encounter: Payer: Self-pay | Admitting: Family Medicine

## 2019-04-05 ENCOUNTER — Other Ambulatory Visit: Payer: Self-pay

## 2019-04-05 DIAGNOSIS — Z20822 Contact with and (suspected) exposure to covid-19: Secondary | ICD-10-CM

## 2019-04-07 LAB — NOVEL CORONAVIRUS, NAA: SARS-CoV-2, NAA: NOT DETECTED

## 2019-04-25 ENCOUNTER — Encounter: Payer: No Typology Code available for payment source | Admitting: Gynecology

## 2019-04-30 ENCOUNTER — Other Ambulatory Visit: Payer: Self-pay

## 2019-05-01 ENCOUNTER — Ambulatory Visit (INDEPENDENT_AMBULATORY_CARE_PROVIDER_SITE_OTHER): Payer: No Typology Code available for payment source | Admitting: Gynecology

## 2019-05-01 ENCOUNTER — Encounter: Payer: Self-pay | Admitting: Gynecology

## 2019-05-01 VITALS — BP 130/78 | Ht 61.25 in | Wt 197.0 lb

## 2019-05-01 DIAGNOSIS — Z01419 Encounter for gynecological examination (general) (routine) without abnormal findings: Secondary | ICD-10-CM | POA: Diagnosis not present

## 2019-05-01 NOTE — Progress Notes (Signed)
    Jocelyn Cole [birth year] [id number]        58 y.o.  G1P0101 for annual gynecologic exam.  Without gynecologic complaints  Past medical history,surgical history, problem list, medications, allergies, family history and social history were all reviewed and documented as reviewed in the EPIC chart.  ROS:  Performed with pertinent positives and negatives included in the history, assessment and plan.   Additional significant findings : None   Exam: Jocelyn Cole assistant Vitals:   05/01/19 1559  BP: 130/78  Weight: 197 lb (89.4 kg)  Height: 5' 1.25" (1.556 m)   Body mass index is 36.92 kg/m.  General appearance:  Normal affect, orientation and appearance. Skin: Grossly normal HEENT: Without gross lesions.  No cervical or supraclavicular adenopathy. Thyroid normal.  Lungs:  Clear without wheezing, rales or rhonchi Cardiac: RR, without RMG Abdominal:  Soft, nontender, without masses, guarding, rebound, organomegaly or hernia Breasts:  Examined lying and sitting without masses, retractions, discharge or axillary adenopathy. Pelvic:  Ext, BUS, Vagina: With atrophic changes  Adnexa: Without masses or tenderness    Anus and perineum: Normal   Rectovaginal: Normal sphincter tone without palpated masses or tenderness.    Assessment/Plan:  58 y.o. G45P0101 female for annual gynecologic exam.  Status post TAH/BSO for leiomyoma, pain and bleeding.  1. Postmenopausal.  No significant menopausal symptoms. 2. Mammography last week with recall scheduled end of this week.  Breast exam normal today. 3. Pap smear/HPV 2019.  No Pap smear done today.  No history of abnormal Pap smears.  Options to stop screening per current screening guidelines reviewed.  Will readdress on annual basis. 4. Colonoscopy 2012.  Due for repeat in 2 years. 5. DEXA never.  Will plan at age 57. 80. Health maintenance.  No routine lab work done as patient does this elsewhere.  Follow-up 1 year, sooner as  needed.   Anastasio Auerbach MD, 4:17 PM 05/01/2019

## 2019-05-01 NOTE — Patient Instructions (Signed)
Follow-up in 1 year for annual exam, sooner as needed. 

## 2019-05-07 ENCOUNTER — Encounter: Payer: Self-pay | Admitting: Gynecology

## 2019-05-09 ENCOUNTER — Other Ambulatory Visit: Payer: Self-pay | Admitting: Radiology

## 2019-05-14 ENCOUNTER — Other Ambulatory Visit: Payer: Self-pay

## 2019-05-14 ENCOUNTER — Encounter: Payer: Self-pay | Admitting: Gynecology

## 2019-05-14 DIAGNOSIS — I10 Essential (primary) hypertension: Secondary | ICD-10-CM

## 2019-05-14 MED ORDER — AMLODIPINE BESYLATE 10 MG PO TABS: 10.0000 mg | ORAL_TABLET | Freq: Every day | ORAL | 0 refills | Status: DC

## 2019-08-11 ENCOUNTER — Other Ambulatory Visit: Payer: Self-pay | Admitting: Family Medicine

## 2019-08-11 DIAGNOSIS — I10 Essential (primary) hypertension: Secondary | ICD-10-CM

## 2019-08-11 NOTE — Telephone Encounter (Signed)
Requested  medications are  due for refill today yes  Requested medications are on the active medication list yes  Last refill 1/11  Future visit scheduled Not until Sept  Notes to clinic last visit asked for visit to be scheduled and pt has not. (there is a Sept appt)

## 2020-01-31 ENCOUNTER — Other Ambulatory Visit: Payer: Self-pay

## 2020-01-31 ENCOUNTER — Ambulatory Visit (INDEPENDENT_AMBULATORY_CARE_PROVIDER_SITE_OTHER): Payer: No Typology Code available for payment source | Admitting: Family Medicine

## 2020-01-31 ENCOUNTER — Encounter: Payer: Self-pay | Admitting: Family Medicine

## 2020-01-31 VITALS — BP 140/86 | HR 80 | Temp 97.6°F | Ht 61.25 in | Wt 195.8 lb

## 2020-01-31 DIAGNOSIS — I10 Essential (primary) hypertension: Secondary | ICD-10-CM | POA: Diagnosis not present

## 2020-01-31 DIAGNOSIS — Z0001 Encounter for general adult medical examination with abnormal findings: Secondary | ICD-10-CM

## 2020-01-31 DIAGNOSIS — Z0000 Encounter for general adult medical examination without abnormal findings: Secondary | ICD-10-CM

## 2020-01-31 MED ORDER — AMLODIPINE BESYLATE 10 MG PO TABS: ORAL_TABLET | ORAL | 3 refills | Status: DC

## 2020-01-31 NOTE — Progress Notes (Signed)
9/30/20211:42 PM  Jocelyn Cole 1961-04-10, 59 y.o., female [id number]  Chief Complaint  Patient presents with  . Annual Exam    lab done 8/9 lipid and bmp, a1c 5.7, Has results in cell. pt declined vision screening per ins cost    HPI:   Patient is a 59 y.o. female with past medical history significant for HTN and DM2 who presents today for CPE  Last CPE sept 2020 Cervical Cancer Screening: Dr Modena Morrow, last pap Dec 2019, last [place] Dec 2020 Breast Cancer Screening:  Dec 2020, birads 4, per patient bx was benign Colorectal Cancer Screening: Dr Fuller Plan, colonoscopy due 2022 Bone Density Testing: at age 60 per obgyn HIV Screening: 2017 STI Screening: 2020 Seasonal Influenza Vaccination: declines Td/Tdap Vaccination: declines Pneumococcal Vaccination: declines Zoster Vaccination: declines covid vaccine: completed series Frequency of Dental evaluation: Q6 months, last OV sept 2021 Frequency of Eye evaluation: wears eyeglasses, yearly, at [facility], last appt this month, per patient no retinopathy  Sees Dr Chalmers Cater, endo LDL 115, HDL 65, crt 0.8  There are no preventive care reminders to display for this patient.  Depression screen [facility] 2/9 01/31/2020 01/29/2019 05/26/2018  Decreased Interest 0 0 0  Down, Depressed, Hopeless 0 0 0  PHQ - 2 Score 0 0 0    Fall Risk  01/31/2020 01/29/2019 05/26/2018 03/08/2018 01/25/2018  Falls in the past year? 0 0 0 0 No  Number falls in past yr: 0 0 - - -  Injury with Fall? 0 0 - - -     No Known Allergies  Prior to Admission medications   Medication Sig Start Date End Date Taking? Authorizing Provider  amLODipine (NORVASC) 10 MG tablet TAKE 1 TABLET(10 MG) BY MOUTH DAILY 08/13/19  Yes Rutherford Guys, MD  Cholecalciferol (VITAMIN D) 2000 UNITS tablet Take 2,000 Units by mouth daily.   Yes [provider]  IRON PO Take by mouth.     Yes [provider]  SitaGLIPtin-MetFORMIN HCl (JANUMET XR) 50-1000 MG TB24 Take 1 tablet  by mouth 2 (two) times daily.   Yes Jacelyn Pi, MD    Past Medical History:  Diagnosis Date  . Anemia    takes Iron PO daily  . Diabetes mellitus without complication ([place])   . Hypertension     Past Surgical History:  Procedure Laterality Date  . ABDOMINAL HYSTERECTOMY N/A 10/22/2014   Procedure: HYSTERECTOMY ABDOMINAL, CYSTOSCOPY;  Surgeon: Anastasio Auerbach, MD;  Location: [facility];  Service: Gynecology;  Laterality: N/A;  request to follow 7:30am case.  Requested 2 hours OR time for case  . APPENDECTOMY  AGE 27  . COLONOSCOPY  03/02/2011   DR. Fuller Plan  . SALPINGOOPHORECTOMY Bilateral 10/22/2014   Procedure: SALPINGO OOPHORECTOMY;  Surgeon: Anastasio Auerbach, MD;  Location: [facility];  Service: Gynecology;  Laterality: Bilateral;    Social History   Tobacco Use  . Smoking status: Never Smoker  . Smokeless tobacco: Never Used  Substance Use Topics  . Alcohol use: Yes    Alcohol/week: 0.0 standard drinks    Comment: Rare    Family History  Problem Relation Age of Onset  . Diabetes Father   . Heart disease Father   . Stomach cancer Father   . Hypertension Sister   . Heart failure Sister   . Hypertension Brother   . Diabetes Brother   . Heart failure Brother   . Hypertension Mother   . Diabetes Mother   . Dementia Mother   .  Hypertension Brother   . Hypertension Brother   . Diabetes Sister   . Hypertension Sister   . Hypertension Sister   . Colon cancer Neg Hx     Review of Systems  Constitutional: Negative for chills and fever.  HENT: Negative for hearing loss and tinnitus.   Eyes: Negative for blurred vision and double vision.  Respiratory: Negative for cough and shortness of breath.   Cardiovascular: Negative for chest pain, palpitations and leg swelling.  Gastrointestinal: Negative for abdominal pain, blood in stool, constipation, diarrhea, heartburn, melena, nausea and vomiting.  Genitourinary: Negative for dysuria, frequency, hematuria and urgency.    Musculoskeletal: Negative for joint pain and myalgias.  Neurological: Negative for dizziness, tingling, focal weakness and headaches.  Endo/Heme/Allergies: Negative for polydipsia.  Psychiatric/Behavioral: Negative for depression. The patient is not nervous/anxious and does not have insomnia.      OBJECTIVE:  Today's Vitals   01/31/20 1338  BP: 140/86  Pulse: 80  Temp: 97.6 F (36.4 C)  SpO2: 98%  Weight: 195 lb 12.8 oz (88.8 kg)  Height: 5' 1.25" (1.556 m)   Body mass index is 36.69 kg/m.  Wt Readings from Last 3 Encounters:  01/31/20 195 lb 12.8 oz (88.8 kg)  05/01/19 197 lb (89.4 kg)  01/29/19 194 lb 6.4 oz (88.2 kg)    Physical Exam Vitals and nursing note reviewed.  Constitutional:      Appearance: She is well-developed.  HENT:     Head: Normocephalic and atraumatic.     Right Ear: Hearing, tympanic membrane, ear canal and external ear normal.     Left Ear: Hearing, tympanic membrane, ear canal and external ear normal.     Mouth/Throat:     Mouth: Mucous membranes are moist.     Pharynx: No oropharyngeal exudate or posterior oropharyngeal erythema.  Eyes:     Extraocular Movements: Extraocular movements intact.     Conjunctiva/sclera: Conjunctivae normal.     Pupils: Pupils are equal, round, and reactive to light.  Neck:     Thyroid: No thyromegaly.  Cardiovascular:     Rate and Rhythm: Normal rate and regular rhythm.     Heart sounds: Normal heart sounds. No murmur heard.  No friction rub. No gallop.   Pulmonary:     Effort: Pulmonary effort is normal.     Breath sounds: Normal breath sounds. No wheezing, rhonchi or rales.  Abdominal:     General: Bowel sounds are normal. There is no distension.     Palpations: Abdomen is soft. There is no hepatomegaly, splenomegaly or mass.     Tenderness: There is no abdominal tenderness.  Musculoskeletal:        General: Normal range of motion.     Cervical back: Neck supple.     Right lower leg: No edema.      Left lower leg: No edema.  Lymphadenopathy:     Cervical: No cervical adenopathy.  Skin:    General: Skin is warm and dry.  Neurological:     Mental Status: She is alert and oriented to person, place, and time.     Cranial Nerves: No cranial nerve deficit.     Gait: Gait normal.     Deep Tendon Reflexes: Reflexes are normal and symmetric.  Psychiatric:        Mood and Affect: Mood normal.        Behavior: Behavior normal.     No results found for this or any previous visit (from the past 24  hour(s)).  No results found.   ASSESSMENT and PLAN  1. Annual physical exam No concerns per history or exam. Routine HCM labs ordered. HCM reviewed/discussed. Anticipatory guidance regarding healthy weight, lifestyle and choices given.   2. Essential hypertension Controlled. Continue current regime.  - amLODipine (NORVASC) 10 MG tablet; TAKE 1 TABLET(10 MG) BY MOUTH DAILY  Return in about 1 year (around 01/30/2021).    Rutherford Guys, MD Primary Care at [place], [place] [address] Ph.  [phone] Fax [phone]

## 2020-01-31 NOTE — Patient Instructions (Addendum)
   If you have lab work done today you will be contacted with your lab results within the next 2 weeks.  If you have not heard from us then please contact us. The fastest way to get your results is to register for My Chart.   IF you received an x-ray today, you will receive an invoice from [facility]. Please contact [facility] at [phone] with questions or concerns regarding your invoice.   IF you received labwork today, you will receive an invoice from [facility]. Please contact [facility] at [phone] with questions or concerns regarding your invoice.   Our billing staff will not be able to assist you with questions regarding bills from these companies.  You will be contacted with the lab results as soon as they are available. The fastest way to get your results is to activate your My Chart account. Instructions are located on the last page of this paperwork. If you have not heard from us regarding the results in 2 weeks, please contact this office.     Preventive Care 40-64 Years Old, Female Preventive care refers to visits with your health care provider and lifestyle choices that can promote health and wellness. This includes:  A yearly physical exam. This may also be called an annual well check.  Regular dental visits and eye exams.  Immunizations.  Screening for certain conditions.  Healthy lifestyle choices, such as eating a healthy diet, getting regular exercise, not using drugs or products that contain nicotine and tobacco, and limiting alcohol use. What can I expect for my preventive care visit? Physical exam Your health care provider will check your:  Height and weight. This may be used to calculate body mass index (BMI), which tells if you are at a healthy weight.  Heart rate and blood pressure.  Skin for abnormal spots. Counseling Your health care provider may ask you questions about your:  Alcohol, tobacco, and drug use.  Emotional  well-being.  Home and relationship well-being.  Sexual activity.  Eating habits.  Work and work environment.  Method of birth control.  Menstrual cycle.  Pregnancy history. What immunizations do I need?  Influenza (flu) vaccine  This is recommended every year. Tetanus, diphtheria, and pertussis (Tdap) vaccine  You may need a Td booster every 10 years. Varicella (chickenpox) vaccine  You may need this if you have not been vaccinated. Zoster (shingles) vaccine  You may need this after age 60. Measles, mumps, and rubella (MMR) vaccine  You may need at least one dose of MMR if you were born in 1957 or later. You may also need a second dose. Pneumococcal conjugate (PCV13) vaccine  You may need this if you have certain conditions and were not previously vaccinated. Pneumococcal polysaccharide (PPSV23) vaccine  You may need one or two doses if you smoke cigarettes or if you have certain conditions. Meningococcal conjugate (MenACWY) vaccine  You may need this if you have certain conditions. Hepatitis A vaccine  You may need this if you have certain conditions or if you travel or work in places where you may be exposed to hepatitis A. Hepatitis B vaccine  You may need this if you have certain conditions or if you travel or work in places where you may be exposed to hepatitis B. Haemophilus influenzae type b (Hib) vaccine  You may need this if you have certain conditions. Human papillomavirus (HPV) vaccine  If recommended by your health care provider, you may need three doses over 6 months.   You may receive vaccines as individual doses or as more than one vaccine together in one shot (combination vaccines). Talk with your health care provider about the risks and benefits of combination vaccines. What tests do I need? Blood tests  Lipid and cholesterol levels. These may be checked every 5 years, or more frequently if you are over 50 years old.  Hepatitis C  test.  Hepatitis B test. Screening  Lung cancer screening. You may have this screening every year starting at age 55 if you have a 30-pack-year history of smoking and currently smoke or have quit within the past 15 years.  Colorectal cancer screening. All adults should have this screening starting at age 50 and continuing until age 75. Your health care provider may recommend screening at age 45 if you are at increased risk. You will have tests every 1-10 years, depending on your results and the type of screening test.  Diabetes screening. This is done by checking your blood sugar (glucose) after you have not eaten for a while (fasting). You may have this done every 1-3 years.  Mammogram. This may be done every 1-2 years. Talk with your health care provider about when you should start having regular mammograms. This may depend on whether you have a family history of breast cancer.  BRCA-related cancer screening. This may be done if you have a family history of breast, ovarian, tubal, or peritoneal cancers.  Pelvic exam and Pap test. This may be done every 3 years starting at age 21. Starting at age 30, this may be done every 5 years if you have a Pap test in combination with an HPV test. Other tests  Sexually transmitted disease (STD) testing.  Bone density scan. This is done to screen for osteoporosis. You may have this scan if you are at high risk for osteoporosis. Follow these instructions at home: Eating and drinking  Eat a diet that includes fresh fruits and vegetables, whole grains, lean protein, and low-fat dairy.  Take vitamin and mineral supplements as recommended by your health care provider.  Do not drink alcohol if: ? Your health care provider tells you not to drink. ? You are pregnant, may be pregnant, or are planning to become pregnant.  If you drink alcohol: ? Limit how much you have to 0-1 drink a day. ? Be aware of how much alcohol is in your drink. In the [place] one  drink equals one 12 oz bottle of beer (355 mL), one 5 oz glass of wine (148 mL), or one 1 oz glass of hard liquor (44 mL). Lifestyle  Take daily care of your teeth and gums.  Stay active. Exercise for at least 30 minutes on 5 or more days each week.  Do not use any products that contain nicotine or tobacco, such as cigarettes, e-cigarettes, and chewing tobacco. If you need help quitting, ask your health care provider.  If you are sexually active, practice safe sex. Use a condom or other form of birth control (contraception) in order to prevent pregnancy and STIs (sexually transmitted infections).  If told by your health care provider, take low-dose aspirin daily starting at age 50. What's next?  Visit your health care provider once a year for a well check visit.  Ask your health care provider how often you should have your eyes and teeth checked.  Stay up to date on all vaccines. This information is not intended to replace advice given to you by your health care provider. Make sure   you discuss any questions you have with your health care provider. Document Revised: 12/29/2017 Document Reviewed: 12/29/2017 Elsevier Patient Education  2020 Elsevier Inc.  

## 2020-04-30 ENCOUNTER — Ambulatory Visit (INDEPENDENT_AMBULATORY_CARE_PROVIDER_SITE_OTHER): Payer: No Typology Code available for payment source | Admitting: Family Medicine

## 2020-04-30 ENCOUNTER — Other Ambulatory Visit: Payer: Self-pay

## 2020-04-30 ENCOUNTER — Encounter: Payer: Self-pay | Admitting: Family Medicine

## 2020-04-30 VITALS — BP 138/82 | HR 88 | Temp 98.2°F | Ht 61.25 in | Wt 203.0 lb

## 2020-04-30 DIAGNOSIS — I10 Essential (primary) hypertension: Secondary | ICD-10-CM

## 2020-04-30 DIAGNOSIS — E119 Type 2 diabetes mellitus without complications: Secondary | ICD-10-CM | POA: Diagnosis not present

## 2020-04-30 DIAGNOSIS — E559 Vitamin D deficiency, unspecified: Secondary | ICD-10-CM

## 2020-04-30 MED ORDER — ROSUVASTATIN CALCIUM 10 MG PO TABS: 10.0000 mg | ORAL_TABLET | Freq: Every day | ORAL | 3 refills | Status: DC

## 2020-04-30 NOTE — Progress Notes (Signed)
12/29/20213:15 PM  Jocelyn Cole 28-Apr-1961, 59 y.o., female [id number]  Chief Complaint  Patient presents with  . Transitions Of Care    Patient's diabetes is managed by Dr Talmage Nap endocrinenologist, here for BP management    HPI:   Patient is a 59 y.o. female with past medical history significant for HTN, DM who presents today for TOC.  Dr. Talmage Nap at [facility] Eye: 10/21 Mammogram: 1/22 Annual physical 9/21 Sees OB: pap does there Total hysterectomy 6 years ago: took ovaries Dentist q 6 months (next Feb)    HTN Amlodipine 10mg  BP Readings from Last 3 Encounters:  04/30/20 138/82  01/31/20 140/86  05/01/19 130/78   CMP wnL  DM Sitagliptin-Metformin: 50-1000mg  daily Last a1c 8/9: 5.9 Lab Results  Component Value Date   HGBA1C 7.0 (H) 04/13/2013    HLD Total: 194 Trig: 68 HDL: 65 LDL: 115 Last was 14/04/2013  Vitamin D Last vitamin D Lab Results  Component Value Date   VD25OH 36 03/09/2014     Depression screen [facility] 2/9 04/30/2020 01/31/2020 01/29/2019  Decreased Interest 0 0 0  Down, Depressed, Hopeless 0 0 0  PHQ - 2 Score 0 0 0    Fall Risk  01/31/2020 01/29/2019 05/26/2018 03/08/2018 01/25/2018  Falls in the past year? 0 0 0 0 No  Number falls in past yr: 0 0 - - -  Injury with Fall? 0 0 - - -     No Known Allergies  Prior to Admission medications   Medication Sig Start Date End Date Taking? Authorizing Provider  amLODipine (NORVASC) 10 MG tablet TAKE 1 TABLET(10 MG) BY MOUTH DAILY 01/31/20  Yes 02/02/20, Lezlie Lye, MD  Cholecalciferol (VITAMIN D) 2000 UNITS tablet Take 2,000 Units by mouth daily.   Yes [provider]  IRON PO Take by mouth.   Yes [provider]  SitaGLIPtin-MetFORMIN HCl (JANUMET XR) 50-1000 MG TB24 Take 1 tablet by mouth 2 (two) times daily.   Yes 2001, MD    Past Medical History:  Diagnosis Date  . Anemia    takes Iron PO daily  . Diabetes mellitus without complication ([facility]CC)    . Hypertension     Past Surgical History:  Procedure Laterality Date  . ABDOMINAL HYSTERECTOMY N/A 10/22/2014   Procedure: HYSTERECTOMY ABDOMINAL, CYSTOSCOPY;  Surgeon: 10/24/2014, MD;  Location: [facility];  Service: Gynecology;  Laterality: N/A;  request to follow 7:30am case.  Requested 2 hours OR time for case  . APPENDECTOMY  AGE 36  . COLONOSCOPY  03/02/2011   DR. 03/04/2011  . SALPINGOOPHORECTOMY Bilateral 10/22/2014   Procedure: SALPINGO OOPHORECTOMY;  Surgeon: 10/24/2014, MD;  Location: [facility];  Service: Gynecology;  Laterality: Bilateral;    Social History   Tobacco Use  . Smoking status: Never Smoker  . Smokeless tobacco: Never Used  Substance Use Topics  . Alcohol use: Yes    Alcohol/week: 0.0 standard drinks    Comment: Rare    Family History  Problem Relation Age of Onset  . Diabetes Father   . Heart disease Father   . Stomach cancer Father   . Hypertension Sister   . Heart failure Sister   . Hypertension Brother   . Diabetes Brother   . Heart failure Brother   . Hypertension Mother   . Diabetes Mother   . Dementia Mother   . Hypertension Brother   . Hypertension Brother   . Diabetes Sister   .  Hypertension Sister   . Hypertension Sister   . Colon cancer Neg Hx     Review of Systems  Constitutional: Negative for chills, fever and malaise/fatigue.  Eyes: Negative for blurred vision and double vision.  Respiratory: Negative for cough, shortness of breath and wheezing.   Cardiovascular: Negative for chest pain, palpitations and leg swelling.  Gastrointestinal: Negative for abdominal pain, blood in stool, constipation, diarrhea, heartburn, nausea and vomiting.  Genitourinary: Negative for dysuria, frequency and hematuria.  Musculoskeletal: Negative for back pain and joint pain.  Skin: Negative for rash.  Neurological: Negative for dizziness, weakness and headaches.     OBJECTIVE:  Today's Vitals   04/30/20 1430 04/30/20 1446  BP: (!)  161/102 138/82  Pulse: 88   Temp: 98.2 F (36.8 C)   SpO2: 97%   Weight: 203 lb (92.1 kg)   Height: 5' 1.25" (1.556 m)    Body mass index is 38.04 kg/m.   Physical Exam Constitutional:      General: She is not in acute distress.    Appearance: Normal appearance. She is not ill-appearing.  HENT:     Head: Normocephalic.  Cardiovascular:     Rate and Rhythm: Normal rate and regular rhythm.     Pulses: Normal pulses.     Heart sounds: Normal heart sounds. No murmur heard. No friction rub. No gallop.   Pulmonary:     Effort: Pulmonary effort is normal. No respiratory distress.     Breath sounds: Normal breath sounds. No stridor. No wheezing, rhonchi or rales.  Abdominal:     General: Bowel sounds are normal.     Palpations: Abdomen is soft.     Tenderness: There is no abdominal tenderness.  Musculoskeletal:     Right lower leg: Edema present.     Left lower leg: Edema present.  Skin:    General: Skin is warm and dry.  Neurological:     Mental Status: She is alert and oriented to person, place, and time.  Psychiatric:        Mood and Affect: Mood normal.        Behavior: Behavior normal.     No results found for this or any previous visit (from the past 24 hour(s)).  No results found.   ASSESSMENT and PLAN  Problem List Items Addressed This Visit      Cardiovascular and Mediastinum   Essential hypertension   Relevant Medications   rosuvastatin (CRESTOR) 10 MG tablet  On amlodipine at goal < 130/80     Endocrine   Type 2 diabetes mellitus without complication, without long-term current use of insulin (HCC) - Primary   Relevant Medications   rosuvastatin (CRESTOR) 10 MG tablet  A1c 5.9 at goal no changes to medications Statin initiated for LDL goal < 70    Other Visit Diagnoses    Vitamin D deficiency     Vitamin D and calcium for osteoporosis prevention      Return in about 1 year (around 04/30/2021).   Huston Foley Jocelyn Emrich, FNP-BC Primary Care at  [address] [place], [place] [address] Ph.  [phone] Fax [phone]

## 2020-04-30 NOTE — Patient Instructions (Addendum)
Compression socks minimum 89mm compression  Rosuvastatin Tablets What is this medicine? ROSUVASTATIN (roe SOO va sta tin) is known as a HMG-CoA reductase inhibitor or 'statin'. It lowers cholesterol and triglycerides in the blood. This drug may also reduce the risk of heart attack, stroke, or other health problems in patients with risk factors for heart disease. Diet and lifestyle changes are often used with this drug. This medicine may be used for other purposes; ask your health care provider or pharmacist if you have questions. COMMON BRAND NAME(S): Crestor What should I tell my health care provider before I take this medicine? They need to know if you have any of these conditions:  diabetes  if you often drink alcohol  history of stroke  kidney disease  liver disease  muscle aches or weakness  thyroid disease  an unusual or allergic reaction to rosuvastatin, other medicines, foods, dyes, or preservatives  pregnant or trying to get pregnant  breast-feeding How should I use this medicine? Take this medicine by mouth with a glass of water. Follow the directions on the prescription label. Do not cut, crush or chew this medicine. You can take this medicine with or without food. Take your doses at regular intervals. Do not take your medicine more often than directed. Talk to your pediatrician regarding the use of this medicine in children. While this drug may be prescribed for children as young as 45 years old for selected conditions, precautions do apply. Overdosage: If you think you have taken too much of this medicine contact a poison control center or emergency room at once. NOTE: This medicine is only for you. Do not share this medicine with others. What if I miss a dose? If you miss a dose, take it as soon as you can. If your next dose is to be taken in less than 12 hours, then do not take the missed dose. Take the next dose at your regular time. Do not take double or extra  doses. What may interact with this medicine? Do not take this medicine with any of the following medications:  herbal medicines like red yeast rice This medicine may also interact with the following medications:  alcohol  antacids containing aluminum hydroxide or magnesium hydroxide  cyclosporine  other medicines for high cholesterol  some medicines for HIV infection  warfarin This list may not describe all possible interactions. Give your health care provider a list of all the medicines, herbs, non-prescription drugs, or dietary supplements you use. Also tell them if you smoke, drink alcohol, or use illegal drugs. Some items may interact with your medicine. What should I watch for while using this medicine? Visit your doctor or health care professional for regular check-ups. You may need regular tests to make sure your liver is working properly. Your health care professional may tell you to stop taking this medicine if you develop muscle problems. If your muscle problems do not go away after stopping this medicine, contact your health care professional. Do not become pregnant while taking this medicine. Women should inform their health care professional if they wish to become pregnant or think they might be pregnant. There is a potential for serious side effects to an unborn child. Talk to your health care professional or pharmacist for more information. Do not breast-feed an infant while taking this medicine. This medicine may increase blood sugar. Ask your healthcare provider if changes in diet or medicines are needed if you have diabetes. If you are going to need surgery  or other procedure, tell your doctor that you are using this medicine. This drug is only part of a total heart-health program. Your doctor or a dietician can suggest a low-cholesterol and low-fat diet to help. Avoid alcohol and smoking, and keep a proper exercise schedule. This medicine may cause a decrease in Co-Enzyme  Q-10. You should make sure that you get enough Co-Enzyme Q-10 while you are taking this medicine. Discuss the foods you eat and the vitamins you take with your health care professional. What side effects may I notice from receiving this medicine? Side effects that you should report to your doctor or health care professional as soon as possible:  allergic reactions like skin rash, itching or hives, swelling of the face, lips, or tongue  confusion  joint pain  loss of memory  redness, blistering, peeling or loosening of the skin, including inside the mouth  signs and symptoms of high blood sugar such as being more thirsty or hungry or having to urinate more than normal. You may also feel very tired or have blurry vision.  signs and symptoms of muscle injury like dark urine; trouble passing urine or change in the amount of urine; unusually weak or tired; muscle pain or side or back pain  yellowing of the eyes or skin Side effects that usually do not require medical attention (report to your doctor or health care professional if they continue or are bothersome):  constipation  diarrhea  dizziness  gas  headache  nausea  stomach pain  trouble sleeping  upset stomach This list may not describe all possible side effects. Call your doctor for medical advice about side effects. You may report side effects to FDA at 1-800-FDA-1088. Where should I keep my medicine? Keep out of the reach of children. Store at room temperature between 20 and 25 degrees C (68 and 77 degrees F). Keep container tightly closed (protect from moisture). Throw away any unused medicine after the expiration date. NOTE: This sheet is a summary. It may not cover all possible information. If you have questions about this medicine, talk to your doctor, pharmacist, or health care provider.  2020 Elsevier/Gold Standard (2018-02-09 08:25:08)     If you have lab work done today you will be contacted with your lab  results within the next 2 weeks.  If you have not heard from [place] then please contact us. The fastest way to get your results is to register for My Chart.   IF you received an x-ray today, you will receive an invoice from [facility]. Please contact [facility] at [phone] with questions or concerns regarding your invoice.   IF you received labwork today, you will receive an invoice from [place]. Please contact [facility] at [phone] with questions or concerns regarding your invoice.   Our billing staff will not be able to assist you with questions regarding bills from these companies.  You will be contacted with the lab results as soon as they are available. The fastest way to get your results is to activate your My Chart account. Instructions are located on the last page of this paperwork. If you have not heard from [place] regarding the results in 2 weeks, please contact this office.      Health Maintenance for Postmenopausal Women Menopause is a normal process in which your ability to get pregnant comes to an end. This process happens slowly over many months or years, usually between the ages of 1 and 4. Menopause is complete when you  have missed your menstrual periods for 12 months. It is important to talk with your health care provider about some of the most common conditions that affect women after menopause (postmenopausal women). These include heart disease, cancer, and bone loss (osteoporosis). Adopting a healthy lifestyle and getting preventive care can help to promote your health and wellness. The actions you take can also lower your chances of developing some of these common conditions. What should I know about menopause? During menopause, you may get a number of symptoms, such as:  Hot flashes. These can be moderate or severe.  Night sweats.  Decrease in sex drive.  Mood swings.  Headaches.  Tiredness.  Irritability.  Memory  problems.  Insomnia. Choosing to treat or not to treat these symptoms is a decision that you make with your health care provider. Do I need hormone replacement therapy?  Hormone replacement therapy is effective in treating symptoms that are caused by menopause, such as hot flashes and night sweats.  Hormone replacement carries certain risks, especially as you become older. If you are thinking about using estrogen or estrogen with progestin, discuss the benefits and risks with your health care provider. What is my risk for heart disease and stroke? The risk of heart disease, heart attack, and stroke increases as you age. One of the causes may be a change in the body's hormones during menopause. This can affect how your body uses dietary fats, triglycerides, and cholesterol. Heart attack and stroke are medical emergencies. There are many things that you can do to help prevent heart disease and stroke. Watch your blood pressure  High blood pressure causes heart disease and increases the risk of stroke. This is more likely to develop in people who have high blood pressure readings, are of African descent, or are overweight.  Have your blood pressure checked: ? Every 3-5 years if you are 91-64 years of age. ? Every year if you are 43 years old or older. Eat a healthy diet   Eat a diet that includes plenty of vegetables, fruits, low-fat dairy products, and lean protein.  Do not eat a lot of foods that are high in solid fats, added sugars, or sodium. Get regular exercise Get regular exercise. This is one of the most important things you can do for your health. Most adults should:  Try to exercise for at least 150 minutes each week. The exercise should increase your heart rate and make you sweat (moderate-intensity exercise).  Try to do strengthening exercises at least twice each week. Do these in addition to the moderate-intensity exercise.  Spend less time sitting. Even light physical  activity can be beneficial. Other tips  Work with your health care provider to achieve or maintain a healthy weight.  Do not use any products that contain nicotine or tobacco, such as cigarettes, e-cigarettes, and chewing tobacco. If you need help quitting, ask your health care provider.  Know your numbers. Ask your health care provider to check your cholesterol and your blood sugar (glucose). Continue to have your blood tested as directed by your health care provider. Do I need screening for cancer? Depending on your health history and family history, you may need to have cancer screening at different stages of your life. This may include screening for:  Breast cancer.  Cervical cancer.  Lung cancer.  Colorectal cancer. What is my risk for osteoporosis? After menopause, you may be at increased risk for osteoporosis. Osteoporosis is a condition in which bone destruction happens more  quickly than new bone creation. To help prevent osteoporosis or the bone fractures that can happen because of osteoporosis, you may take the following actions:  If you are 28-24 years old, get at least 1,000 mg of calcium and at least 600 mg of vitamin D per day.  If you are older than age 23 but younger than age 20, get at least 1,200 mg of calcium and at least 600 mg of vitamin D per day.  If you are older than age 51, get at least 1,200 mg of calcium and at least 800 mg of vitamin D per day. Smoking and drinking excessive alcohol increase the risk of osteoporosis. Eat foods that are rich in calcium and vitamin D, and do weight-bearing exercises several times each week as directed by your health care provider. How does menopause affect my mental health? Depression may occur at any age, but it is more common as you become older. Common symptoms of depression include:  Low or sad mood.  Changes in sleep patterns.  Changes in appetite or eating patterns.  Feeling an overall lack of motivation or  enjoyment of activities that you previously enjoyed.  Frequent crying spells. Talk with your health care provider if you think that you are experiencing depression. General instructions See your health care provider for regular wellness exams and vaccines. This may include:  Scheduling regular health, dental, and eye exams.  Getting and maintaining your vaccines. These include: ? Influenza vaccine. Get this vaccine each year before the flu season begins. ? Pneumonia vaccine. ? Shingles vaccine. ? Tetanus, diphtheria, and pertussis (Tdap) booster vaccine. Your health care provider may also recommend other immunizations. Tell your health care provider if you have ever been abused or do not feel safe at home. Summary  Menopause is a normal process in which your ability to get pregnant comes to an end.  This condition causes hot flashes, night sweats, decreased interest in sex, mood swings, headaches, or lack of sleep.  Treatment for this condition may include hormone replacement therapy.  Take actions to keep yourself healthy, including exercising regularly, eating a healthy diet, watching your weight, and checking your blood pressure and blood sugar levels.  Get screened for cancer and depression. Make sure that you are up to date with all your vaccines. This information is not intended to replace advice given to you by your health care provider. Make sure you discuss any questions you have with your health care provider. Document Revised: 04/12/2018 Document Reviewed: 04/12/2018 Elsevier Patient Education  2020 [employer].

## 2020-05-01 ENCOUNTER — Encounter: Payer: No Typology Code available for payment source | Admitting: Obstetrics and Gynecology

## 2020-05-07 ENCOUNTER — Encounter: Payer: Self-pay | Admitting: Obstetrics and Gynecology

## 2020-05-07 ENCOUNTER — Ambulatory Visit (INDEPENDENT_AMBULATORY_CARE_PROVIDER_SITE_OTHER): Payer: No Typology Code available for payment source | Admitting: Obstetrics and Gynecology

## 2020-05-07 ENCOUNTER — Other Ambulatory Visit: Payer: Self-pay

## 2020-05-07 VITALS — BP 138/80 | Ht 61.0 in | Wt 201.0 lb

## 2020-05-07 DIAGNOSIS — Z01419 Encounter for gynecological examination (general) (routine) without abnormal findings: Secondary | ICD-10-CM

## 2020-05-07 NOTE — Progress Notes (Signed)
   HARMONEE TOZER 04-Jul-1960 [id number]  SUBJECTIVE:  60 y.o. G59P0101 female for annual routine breast and pelvic exam. She has no gynecologic concerns.  Current Outpatient Medications  Medication Sig Dispense Refill  . amLODipine (NORVASC) 10 MG tablet TAKE 1 TABLET(10 MG) BY MOUTH DAILY 90 tablet 3  . Cholecalciferol (VITAMIN D) 2000 UNITS tablet Take 2,000 Units by mouth daily.    . IRON PO Take by mouth.    . rosuvastatin (CRESTOR) 10 MG tablet Take 1 tablet (10 mg total) by mouth daily. 90 tablet 3  . SitaGLIPtin-MetFORMIN HCl (JANUMET XR) 50-1000 MG TB24 Take 1 tablet by mouth 2 (two) times daily.     No current facility-administered medications for this visit.   Allergies: Patient has no known allergies.  Patient's last menstrual period was 02/11/2014.  Past medical history,surgical history, problem list, medications, allergies, family history and social history were all reviewed and documented as reviewed in the EPIC chart.  ROS: Pertinent positives and negatives as reviewed in HPI   OBJECTIVE:  BP 138/80 (BP Location: Right Arm, Patient Position: Sitting, Cuff Size: Normal)   Ht 5\' 1"  (1.549 m)   Wt 201 lb (91.2 kg)   LMP 02/11/2014   BMI 37.98 kg/m  The patient appears well, alert, oriented, in no distress.  BREAST EXAM: breasts appear normal, no suspicious masses, no skin or nipple changes or axillary nodes  PELVIC EXAM: VULVA: normal appearing vulva with atrophic change, no masses, tenderness or lesions, VAGINA: normal appearing vagina with atrophic change, normal color and discharge, no lesions, CERVIX: surgically absent, UTERUS: surgically absent, vaginal cuff normal, ADNEXA: no masses, nontender  Chaperone: 04/13/2014 Bon[place] present during the examination  ASSESSMENT:  60 y.o. G1P0101 here for a routine breast and pelvic exam  PLAN:   1. Postmenopausal. Prior TAH BSO for leiomyoma and AUB.  No significant menopausal symptoms. 2. Pap smear/HPV 04/2018.  No  history of abnormal Pap smears.  We discussed screening guidelines.  We will readdress on an annual basis.  She decides to continue, next Pap smear due 2024 following the current guidelines recommending the 5 year interval. 3. Mammogram scheduled for tomorrow per patient. She had a right breast biopsy 05/2019 which showed benign fibroadenomatoid and fibrocystic changes. Normal breast exam today. 4. Colonoscopy 2012.  Colonoscopy appears due so she plans to get this scheduled upon turning 60. 5. DEXA never, recommend at age 38. 6. Health maintenance.  No labs today as she normally has these completed elsewhere.  Recently started on rosuvastatin after she saw her PCP last week and also followed there for hypertension.  Return annually or sooner, prn.  67 MD 05/07/20

## 2021-02-04 ENCOUNTER — Encounter: Payer: No Typology Code available for payment source | Admitting: Family Medicine

## 2021-05-08 ENCOUNTER — Ambulatory Visit: Payer: No Typology Code available for payment source | Admitting: Nurse Practitioner

## 2021-05-11 ENCOUNTER — Ambulatory Visit: Payer: No Typology Code available for payment source | Admitting: Nurse Practitioner

## 2021-06-09 ENCOUNTER — Encounter: Payer: Self-pay | Admitting: Gastroenterology

## 2021-07-13 ENCOUNTER — Ambulatory Visit (AMBULATORY_SURGERY_CENTER): Payer: PRIVATE HEALTH INSURANCE | Admitting: *Deleted

## 2021-07-13 ENCOUNTER — Other Ambulatory Visit: Payer: Self-pay

## 2021-07-13 VITALS — Ht 61.0 in | Wt 187.0 lb

## 2021-07-13 DIAGNOSIS — Z1211 Encounter for screening for malignant neoplasm of colon: Secondary | ICD-10-CM

## 2021-07-13 MED ORDER — NA SULFATE-K SULFATE-MG SULF 17.5-3.13-1.6 GM/177ML PO SOLN: 1.0000 | Freq: Once | ORAL | 0 refills | Status: AC

## 2021-07-13 NOTE — Progress Notes (Signed)
No egg or soy allergy known to patient  ?No issues known to pt with past sedation with any surgeries or procedures ?Patient denies ever being told they had issues or difficulty with intubation  ?No FH of Malignant Hyperthermia ?Pt is not on diet pills ?Pt is not on  home 02  ?Pt is not on blood thinners  ?Pt denies issues with constipation  ?No A fib or A flutter ? ?Pt is fully vaccinated  for Covid  ? ?  NO PA's for preps discussed with pt In PV today  ?Discussed with pt there will be an out-of-pocket cost for prep and that varies from $0 to 70 +  dollars - pt verbalized understanding  ? ?Due to the COVID-19 pandemic we are asking patients to follow certain guidelines in PV and the [place]   ?Pt aware of COVID protocols and [facility] guidelines  ? ?PV completed over the phone. Pt verified name, DOB, address and insurance during PV today.  ?Pt emailed instruction packet to [email or web address]  ?Pt encouraged to call with questions or issues.  ?If pt has My chart, procedure instructions sent via My Chart  ? ?

## 2021-07-22 ENCOUNTER — Encounter: Payer: Self-pay | Admitting: Gastroenterology

## 2021-07-27 ENCOUNTER — Encounter: Payer: Self-pay | Admitting: Gastroenterology

## 2021-07-27 ENCOUNTER — Other Ambulatory Visit: Payer: Self-pay

## 2021-07-27 ENCOUNTER — Ambulatory Visit (AMBULATORY_SURGERY_CENTER): Payer: PRIVATE HEALTH INSURANCE | Admitting: Gastroenterology

## 2021-07-27 VITALS — BP 113/77 | HR 78 | Temp 98.7°F | Resp 19 | Ht 61.0 in | Wt 187.0 lb

## 2021-07-27 DIAGNOSIS — Z1212 Encounter for screening for malignant neoplasm of rectum: Secondary | ICD-10-CM

## 2021-07-27 DIAGNOSIS — Z1211 Encounter for screening for malignant neoplasm of colon: Secondary | ICD-10-CM | POA: Diagnosis present

## 2021-07-27 NOTE — Patient Instructions (Signed)
YOU HAD AN ENDOSCOPIC PROCEDURE TODAY AT [facility]:   Refer to the procedure report that was given to you for any specific questions about what was found during the examination.  If the procedure report does not answer your questions, please call your gastroenterologist to clarify.  If you requested that your care partner not be given the details of your procedure findings, then the procedure report has been included in a sealed envelope for you to review at your convenience later. ? ?**handouts given on hemorrhoids and diverticulosis*8 ? ?YOU SHOULD EXPECT: Some feelings of bloating in the abdomen. Passage of more gas than usual.  Walking can help get rid of the air that was put into your GI tract during the procedure and reduce the bloating. If you had a lower endoscopy (such as a colonoscopy or flexible sigmoidoscopy) you may notice spotting of blood in your stool or on the toilet paper. If you underwent a bowel prep for your procedure, you may not have a normal bowel movement for a few days. ? ?Please Note:  You might notice some irritation and congestion in your nose or some drainage.  This is from the oxygen used during your procedure.  There is no need for concern and it should clear up in a day or so. ? ?SYMPTOMS TO REPORT IMMEDIATELY: ? ?Following lower endoscopy (colonoscopy or flexible sigmoidoscopy): ? Excessive amounts of blood in the stool ? Significant tenderness or worsening of abdominal pains ? Swelling of the abdomen that is new, acute ? Fever of 100?F or higher ? ?For urgent or emergent issues, a gastroenterologist can be reached at any hour by calling [phone]. ?Do not use MyChart messaging for urgent concerns.  ? ? ?DIET:  We do recommend a small meal at first, but then you may proceed to your regular diet.  Drink plenty of fluids but you should avoid alcoholic beverages for 24 hours. ? ?ACTIVITY:  You should plan to take it easy for the rest of today and you should NOT  DRIVE or use heavy machinery until tomorrow (because of the sedation medicines used during the test).   ? ?FOLLOW UP: ?Our staff will call the number listed on your records 48-72 hours following your procedure to check on you and address any questions or concerns that you may have regarding the information given to you following your procedure. If we do not reach you, we will leave a message.  We will attempt to reach you two times.  During this call, we will ask if you have developed any symptoms of COVID 19. If you develop any symptoms (ie: fever, flu-like symptoms, shortness of breath, cough etc.) before then, please call [phone].  If you test positive for Covid 19 in the 2 weeks post procedure, please call and report this information to [place].   ? ?If any biopsies were taken you will be contacted by phone or by letter within the next 1-3 weeks.  Please call us at [phone] if you have not heard about the biopsies in 3 weeks.  ? ? ?SIGNATURES/CONFIDENTIALITY: ?You and/or your care partner have signed paperwork which will be entered into your electronic medical record.  These signatures attest to the fact that that the information above on your After Visit Summary has been reviewed and is understood.  Full responsibility of the confidentiality of this discharge information lies with you and/or your care-partner.  ?

## 2021-07-27 NOTE — Op Note (Signed)
[facility] ?Patient Name: Jocelyn Cole ?Procedure Date: 07/27/2021 9:21 AM ?MRN: [id number] ?Endoscopist: Ladene Artist , MD ?Age: 61 ?Referring MD:  ?Date of Birth: 05-20-1960 ?Gender: Female ?Account #: [id number] ?Procedure:                Colonoscopy ?Indications:              Screening for colorectal malignant neoplasm ?Medicines:                Monitored Anesthesia Care ?Procedure:                Pre-Anesthesia Assessment: ?                          - Prior to the procedure, a History and Physical  ?                          was performed, and patient medications and  ?                          allergies were reviewed. The patient's tolerance of  ?                          previous anesthesia was also reviewed. The risks  ?                          and benefits of the procedure and the sedation  ?                          options and risks were discussed with the patient.  ?                          All questions were answered, and informed consent  ?                          was obtained. Prior Anticoagulants: The patient has  ?                          taken no previous anticoagulant or antiplatelet  ?                          agents. ASA Grade Assessment: II - A patient with  ?                          mild systemic disease. After reviewing the risks  ?                          and benefits, the patient was deemed in  ?                          satisfactory condition to undergo the procedure. ?                          After obtaining informed consent, the colonoscope  ?  was passed under direct vision. Throughout the  ?                          procedure, the patient's blood pressure, pulse, and  ?                          oxygen saturations were monitored continuously. The  ?                          CF [id number] #[id number] was introduced through the anus  ?                          and advanced to the the cecum, identified by  ?                          appendiceal orifice  and ileocecal valve. The  ?                          ileocecal valve, appendiceal orifice, and rectum  ?                          were photographed. The quality of the bowel  ?                          preparation was good. The colonoscopy was performed  ?                          without difficulty. The patient tolerated the  ?                          procedure well. ?Scope In: 9:30:00 AM ?Scope Out: 9:44:02 AM ?Scope Withdrawal Time: 0 hours 10 minutes 31 seconds  ?Total Procedure Duration: 0 hours 14 minutes 2 seconds  ?Findings:                 The perianal and digital rectal examinations were  ?                          normal. ?                          Internal hemorrhoids were found during  ?                          retroflexion. The hemorrhoids were small and Grade  ?                          I (internal hemorrhoids that do not prolapse). ?                          Multiple medium-mouthed diverticula were found in  ?                          the entire colon, more concentrated in the left  ?  colon. There was no evidence of diverticular  ?                          bleeding. ?                          The exam was otherwise without abnormality on  ?                          direct and retroflexion views. ?Complications:            No immediate complications. Estimated blood loss:  ?                          None. ?Estimated Blood Loss:     Estimated blood loss: none. ?Impression:               - Internal hemorrhoids. ?                          - Moderate diverticulosis in the entire examined  ?                          colon, more concentrated in the left colon. ?                          - The examination was otherwise normal on direct  ?                          and retroflexion views. ?                          - No specimens collected. ?Recommendation:           - Repeat colonoscopy in 10 years for screening  ?                          purposes. ?                          -  Patient has a contact number available for  ?                          emergencies. The signs and symptoms of potential  ?                          delayed complications were discussed with the  ?                          patient. Return to normal activities tomorrow.  ?                          Written discharge instructions were provided to the  ?                          patient. ?                          -  High fiber diet. ?                          - Continue present medications. ?Ladene Artist, MD ?07/27/2021 9:50:44 AM ?This report has been signed electronically. ?

## 2021-07-27 NOTE — Progress Notes (Signed)
? ?History & Physical ? ?Primary Care Physician:  Lin Landsman, MD ?Primary Gastroenterologist: Lucio Edward, MD ? ?CHIEF COMPLAINT:  CRC screening  ? ?HPI: Jocelyn Cole is a 61 y.o. female for CRC screening, average risk, with colonoscopy. ? ? ?Past Medical History:  ?Diagnosis Date  ? Anemia   ? takes Iron PO daily  ? Diabetes mellitus without complication ([place])   ? Hyperlipidemia   ? on rosuvastatin  ? Hypertension   ? ? ?Past Surgical History:  ?Procedure Laterality Date  ? ABDOMINAL HYSTERECTOMY N/A 10/22/2014  ? Procedure: HYSTERECTOMY ABDOMINAL, CYSTOSCOPY;  Surgeon: Anastasio Auerbach, MD;  Location: [facility];  Service: Gynecology;  Laterality: N/A;  request to follow 7:30am case. ? ?Requested 2 hours OR time for case  ? APPENDECTOMY  AGE 25  ? COLONOSCOPY  03/02/2011  ? DR. Fuller Plan  ? SALPINGOOPHORECTOMY Bilateral 10/22/2014  ? Procedure: SALPINGO OOPHORECTOMY;  Surgeon: Anastasio Auerbach, MD;  Location: [facility];  Service: Gynecology;  Laterality: Bilateral;  ? ? ?Prior to Admission medications   ?Medication Sig Start Date End Date Taking? Authorizing Provider  ?amLODipine (NORVASC) 10 MG tablet TAKE 1 TABLET(10 MG) BY MOUTH DAILY 01/31/20  Yes Jacelyn Pi, Lilia Argue, MD  ?Cholecalciferol (VITAMIN D) 2000 UNITS tablet Take 2,000 Units by mouth daily.   Yes [provider]  ?IRON PO Take by mouth.   Yes [provider]  ?MOUNJARO 5 MG/0.5ML Pen Inject into the skin. 06/22/21  Yes [provider]  ?rosuvastatin (CRESTOR) 10 MG tablet Take 1 tablet (10 mg total) by mouth daily. 04/30/20  Yes Just, Laurita Quint, FNP  ?SitaGLIPtin-MetFORMIN HCl (JANUMET XR) 50-1000 MG TB24 Take 1 tablet by mouth 2 (two) times daily.   Yes Jacelyn Pi, MD  ?Lancets (ONETOUCH DELICA PLUS [id number]) [place]    [provider]  ?ONETOUCH VERIO test strip 1 each daily. 01/29/21   [provider]  ? ? ?Current Outpatient Medications  ?Medication Sig Dispense Refill  ? amLODipine  (NORVASC) 10 MG tablet TAKE 1 TABLET(10 MG) BY MOUTH DAILY 90 tablet 3  ? Cholecalciferol (VITAMIN D) 2000 UNITS tablet Take 2,000 Units by mouth daily.    ? IRON PO Take by mouth.    ? MOUNJARO 5 MG/0.5ML Pen Inject into the skin.    ? rosuvastatin (CRESTOR) 10 MG tablet Take 1 tablet (10 mg total) by mouth daily. 90 tablet 3  ? SitaGLIPtin-MetFORMIN HCl (JANUMET XR) 50-1000 MG TB24 Take 1 tablet by mouth 2 (two) times daily.    ? Lancets (ONETOUCH DELICA PLUS [id number]) MISC CHECK ONCE DAILY    ? ONETOUCH VERIO test strip 1 each daily.    ? ?No current facility-administered medications for this visit.  ? ? ?Allergies as of 07/27/2021  ? (No Known Allergies)  ? ? ?Family History  ?Problem Relation Age of Onset  ? Hypertension Mother   ? Diabetes Mother   ? Dementia Mother   ? Diabetes Father   ? Heart disease Father   ? Stomach cancer Father   ? Hypertension Sister   ? Heart failure Sister   ? Diabetes Sister   ? Hypertension Sister   ? Hypertension Sister   ? Hypertension Brother   ? Diabetes Brother   ? Heart failure Brother   ? Hypertension Brother   ? Hypertension Brother   ? Colon cancer Neg Hx   ? Colon polyps Neg Hx   ? Esophageal cancer Neg Hx   ?  Rectal cancer Neg Hx   ? ? ?Social History  ? ?Socioeconomic History  ? Marital status: Married  ?  Spouse name: Not on file  ? Number of children: Not on file  ? Years of education: Not on file  ? Highest education level: Not on file  ?Occupational History  ? Not on file  ?Tobacco Use  ? Smoking status: Never  ? Smokeless tobacco: Never  ?Vaping Use  ? Vaping Use: Never used  ?Substance and Sexual Activity  ? Alcohol use: Yes  ?  Alcohol/week: 0.0 standard drinks  ?  Comment: Rare  ? Drug use: No  ? Sexual activity: Yes  ?  Partners: Male  ?  Birth control/protection: Condom  ?  Comment: 1st intercourse 61 yo-More than 5 partners  ?Other Topics Concern  ? Not on file  ?Social History Narrative  ? Not on file  ? ?Social Determinants of Health  ? ?Financial  Resource Strain: Not on file  ?Food Insecurity: Not on file  ?Transportation Needs: Not on file  ?Physical Activity: Not on file  ?Stress: Not on file  ?Social Connections: Not on file  ?Intimate Partner Violence: Not on file  ? ? ?Review of Systems: ? ?All systems reviewed an negative except where noted in HPI. ? ?Gen: Denies any fever, chills, sweats, anorexia, fatigue, weakness, malaise, weight loss, and sleep disorder ?CV: Denies chest pain, angina, palpitations, syncope, orthopnea, PND, peripheral edema, and claudication. ?Resp: Denies dyspnea at rest, dyspnea with exercise, cough, sputum, wheezing, coughing up blood, and pleurisy. ?GI: Denies vomiting blood, jaundice, and fecal incontinence.   Denies dysphagia or odynophagia. ?GU : Denies urinary burning, blood in urine, urinary frequency, urinary hesitancy, nocturnal urination, and urinary incontinence. ?MS: Denies joint pain, limitation of movement, and swelling, stiffness, low back pain, extremity pain. Denies muscle weakness, cramps, atrophy.  ?Derm: Denies rash, itching, dry skin, hives, moles, warts, or unhealing ulcers.  ?Psych: Denies depression, anxiety, memory loss, suicidal ideation, hallucinations, paranoia, and confusion. ?Heme: Denies bruising, bleeding, and enlarged lymph nodes. ?Neuro:  Denies any headaches, dizziness, paresthesias. ?Endo:  Denies any problems with DM, thyroid, adrenal function. ? ? ?Physical Exam: ?General:  Alert, well-developed, in NAD ?Head:  Normocephalic and atraumatic. ?Eyes:  Sclera clear, no icterus.   Conjunctiva pink. ?Ears:  Normal auditory acuity. ?Mouth:  No deformity or lesions.  ?Neck:  Supple; no masses . ?Lungs:  Clear throughout to auscultation.   No wheezes, crackles, or rhonchi. No acute distress. ?Heart:  Regular rate and rhythm; no murmurs. ?Abdomen:  Soft, nondistended, nontender. No masses, hepatomegaly. No obvious masses.  Normal bowel .    ?Rectal:  Deferred to colonoscopy ?Msk:  Symmetrical without  gross deformities.Marland Kitchen ?Pulses:  Normal pulses noted. ?Extremities:  Without edema. ?Neurologic:  Alert and  oriented x4;  grossly normal neurologically. ?Skin:  Intact without significant lesions or rashes. ?Cervical Nodes:  No significant cervical adenopathy. ?Psych:  Alert and cooperative. Normal mood and affect. ? ?Impression / Plan:  ? ?CRC screening, average risk, for colonoscopy. ? ?Doyce Saling T. Fuller Plan  07/27/2021, 9:21 AM ?See Jocelyn Cole, [facility], to contact our on call provider ? ? ?  ?

## 2021-07-27 NOTE — Progress Notes (Signed)
Report to PACU, RN, vss, BBS= Clear.  

## 2021-07-29 ENCOUNTER — Telehealth: Payer: Self-pay | Admitting: *Deleted

## 2021-07-29 NOTE — Telephone Encounter (Signed)
?  Follow up Call- ? ? ?  07/27/2021  ?  8:37 AM  ?Call back number  ?Post procedure Call Back phone  # [phone]  ?Permission to leave phone message Yes  ?  ? ?Patient questions: ? ?Do you have a fever, pain , or abdominal swelling? No. ?Pain Score  0 * ? ?Have you tolerated food without any problems? Yes.   ? ?Have you been able to return to your normal activities? Yes.   ? ?Do you have any questions about your discharge instructions: ?Diet   No. ?Medications  No. ?Follow up visit  No. ? ?Do you have questions or concerns about your Care? No. ? ?Actions: ?* If pain score is 4 or above: ?No action needed, pain <4. ? ? ?

## 2022-05-12 ENCOUNTER — Ambulatory Visit (INDEPENDENT_AMBULATORY_CARE_PROVIDER_SITE_OTHER): Payer: PRIVATE HEALTH INSURANCE | Admitting: Family Medicine

## 2022-05-12 ENCOUNTER — Encounter: Payer: Self-pay | Admitting: Family Medicine

## 2022-05-12 VITALS — BP 130/84 | HR 85 | Temp 97.6°F | Ht 61.0 in | Wt 190.0 lb

## 2022-05-12 DIAGNOSIS — E785 Hyperlipidemia, unspecified: Secondary | ICD-10-CM

## 2022-05-12 DIAGNOSIS — I10 Essential (primary) hypertension: Secondary | ICD-10-CM

## 2022-05-12 NOTE — Patient Instructions (Addendum)

## 2022-05-12 NOTE — Progress Notes (Signed)
New Patient Office Visit  Subjective    Patient ID: Jocelyn Cole, female    DOB: [birth year]  Age: 62 y.o. MRN: [id number]  CC:  Chief Complaint  Patient presents with   Establish Care    No concerns, needs rx refill    HPI Jocelyn Cole presents to establish care  She sees Dr. Chalmers Cater for DM and [facility].   Hypertension-takes amlodipine 10 mg daily.  Vitamin D deficiency-reports taking 2000 IUs daily.  HL-reports taking rosuvastatin 10 mg.  Denies fever, chills, dizziness, chest pain, palpitations, shortness of breath, abdominal pain, N/V/D, urinary symptoms, LE edema.     Outpatient Encounter Medications as of 05/12/2022  Medication Sig   Cholecalciferol (VITAMIN D) 2000 UNITS tablet Take 2,000 Units by mouth daily.   IRON PO Take by mouth.   Lancets (ONETOUCH DELICA PLUS [id number]) MISC CHECK ONCE DAILY   MOUNJARO 7.5 MG/0.5ML Pen Inject into the skin.   ONETOUCH VERIO test strip 1 each daily.   [DISCONTINUED] amLODipine (NORVASC) 10 MG tablet TAKE 1 TABLET(10 MG) BY MOUTH DAILY   [DISCONTINUED] rosuvastatin (CRESTOR) 10 MG tablet Take 1 tablet (10 mg total) by mouth daily.   [DISCONTINUED] MOUNJARO 5 MG/0.5ML Pen Inject 7.5 mg into the skin. (Patient not taking: Reported on 05/12/2022)   [DISCONTINUED] SitaGLIPtin-MetFORMIN HCl (JANUMET XR) 50-1000 MG TB24 Take 1 tablet by mouth 2 (two) times daily. (Patient not taking: Reported on 05/12/2022)   No facility-administered encounter medications on file as of 05/12/2022.    Past Medical History:  Diagnosis Date   Anemia    takes Iron PO daily   Diabetes mellitus without complication ([place])    Hyperlipidemia    on rosuvastatin   Hypertension     Past Surgical History:  Procedure Laterality Date   ABDOMINAL HYSTERECTOMY N/A 10/22/2014   Procedure: HYSTERECTOMY ABDOMINAL, CYSTOSCOPY;  Surgeon: Anastasio Auerbach, MD;  Location: [facility];  Service: Gynecology;  Laterality: N/A;  request to follow 7:30am  case.  Requested 2 hours OR time for case   APPENDECTOMY  AGE 70   COLONOSCOPY  03/02/2011   DR. Fuller Plan   SALPINGOOPHORECTOMY Bilateral 10/22/2014   Procedure: SALPINGO OOPHORECTOMY;  Surgeon: Anastasio Auerbach, MD;  Location: [facility];  Service: Gynecology;  Laterality: Bilateral;    Family History  Problem Relation Age of Onset   Hypertension Mother    Diabetes Mother    Dementia Mother    Diabetes Father    Heart disease Father    Stomach cancer Father    Hypertension Sister    Heart failure Sister    Diabetes Sister    Hypertension Sister    Hypertension Sister    Hypertension Brother    Diabetes Brother    Heart failure Brother    Hypertension Brother    Hypertension Brother    Colon cancer Neg Hx    Colon polyps Neg Hx    Esophageal cancer Neg Hx    Rectal cancer Neg Hx     Social History   Socioeconomic History   Marital status: Married    Spouse name: Not on file   Number of children: Not on file   Years of education: Not on file   Highest education level: Not on file  Occupational History   Not on file  Tobacco Use   Smoking status: Never   Smokeless tobacco: Never  Vaping Use   Vaping Use: Never used  Substance and Sexual Activity   Alcohol use: Yes  Alcohol/week: 0.0 standard drinks of alcohol    Comment: Rare   Drug use: No   Sexual activity: Yes    Partners: Male    Birth control/protection: Condom    Comment: 1st intercourse 62 yo-More than 5 partners  Other Topics Concern   Not on file  Social History Narrative   Not on file   Social Determinants of Health   Financial Resource Strain: Not on file  Food Insecurity: Not on file  Transportation Needs: Not on file  Physical Activity: Not on file  Stress: Not on file  Social Connections: Not on file  Intimate Partner Violence: Not on file    ROS      Objective    BP 130/84 (BP Location: Left Arm, Patient Position: Sitting, Cuff Size: Large)   Pulse 85   Temp 97.6 F (36.4 C)  (Temporal)   Ht '5\' 1"'$  (1.549 m)   Wt 190 lb (86.2 kg)   LMP 02/10/2014   SpO2 98%   BMI 35.90 kg/m   Physical Exam Constitutional:      General: She is not in acute distress.    Appearance: She is not ill-appearing.  Cardiovascular:     Rate and Rhythm: Normal rate and regular rhythm.  Pulmonary:     Effort: Pulmonary effort is normal.     Breath sounds: Normal breath sounds.  Neurological:     General: No focal deficit present.     Mental Status: She is alert and oriented to person, place, and time.  Psychiatric:        Mood and Affect: Mood normal.        Behavior: Behavior normal.        Thought Content: Thought content normal.         Assessment & Plan:   Problem List Items Addressed This Visit       Cardiovascular and Mediastinum   Essential hypertension - Primary   Relevant Orders   CBC with Differential/Platelet (Completed)   Comprehensive metabolic panel (Completed)   Other Visit Diagnoses     Hyperlipidemia, unspecified hyperlipidemia type       Relevant Orders   Lipid panel (Completed)      She is a pleasant 62 year old female who is new to the practice here to establish care.  She will continue seeing endocrinology for diabetes and Mounjaro prescriptions. Check labs due to hypertension and hyperlipidemia.  I will refill medications once I have her lab results and offer recommendations.  Monitor blood pressure at home.  Follow-up in 3 months or sooner if needed  Return in about 3 months (around 08/11/2022).   Harland Dingwall, NP-C

## 2022-05-13 LAB — COMPREHENSIVE METABOLIC PANEL
ALT: 14 U/L (ref 0–35)
AST: 16 U/L (ref 0–37)
Albumin: 4.4 g/dL (ref 3.5–5.2)
Alkaline Phosphatase: 85 U/L (ref 39–117)
BUN: 13 mg/dL (ref 6–23)
CO2: 30 mEq/L (ref 19–32)
Calcium: 9.9 mg/dL (ref 8.4–10.5)
Chloride: 102 mEq/L (ref 96–112)
Creatinine, Ser: 0.8 mg/dL (ref 0.40–1.20)
GFR: 79.42 mL/min (ref >60.00)
Glucose, Bld: 96 mg/dL (ref 70–99)
Potassium: 4.1 mEq/L (ref 3.5–5.1)
Sodium: 139 mEq/L (ref 135–145)
Total Bilirubin: 0.5 mg/dL (ref 0.2–1.2)
Total Protein: 7.5 g/dL (ref 6.0–8.3)

## 2022-05-13 LAB — LIPID PANEL
Cholesterol: 217 mg/dL — ABNORMAL HIGH (ref 0–200)
HDL: 63.2 mg/dL (ref >39.00)
LDL Cholesterol: 140 mg/dL — ABNORMAL HIGH (ref 0–99)
NonHDL: 153.61
Total CHOL/HDL Ratio: 3
Triglycerides: 66 mg/dL (ref 0.0–149.0)
VLDL: 13.2 mg/dL (ref 0.0–40.0)

## 2022-05-13 LAB — CBC WITH DIFFERENTIAL/PLATELET
Basophils Absolute: 0.1 10*3/uL (ref 0.0–0.1)
Basophils Relative: 1.2 % (ref 0.0–3.0)
Eosinophils Absolute: 0.1 10*3/uL (ref 0.0–0.7)
Eosinophils Relative: 1.6 % (ref 0.0–5.0)
HCT: 42 % (ref 36.0–46.0)
Hemoglobin: 13.8 g/dL (ref 12.0–15.0)
Lymphocytes Relative: 47 % — ABNORMAL HIGH (ref 12.0–46.0)
Lymphs Abs: 3.2 10*3/uL (ref 0.7–4.0)
MCHC: 32.8 g/dL (ref 30.0–36.0)
MCV: 74.5 fl — ABNORMAL LOW (ref 78.0–100.0)
Monocytes Absolute: 0.6 10*3/uL (ref 0.1–1.0)
Monocytes Relative: 8.9 % (ref 3.0–12.0)
Neutro Abs: 2.8 10*3/uL (ref 1.4–7.7)
Neutrophils Relative %: 41.3 % — ABNORMAL LOW (ref 43.0–77.0)
Platelets: 256 10*3/uL (ref 150.0–400.0)
RBC: 5.64 Mil/uL — ABNORMAL HIGH (ref 3.87–5.11)
RDW: 15.4 % (ref 11.5–15.5)
WBC: 6.8 10*3/uL (ref 4.0–10.5)

## 2022-05-14 ENCOUNTER — Other Ambulatory Visit: Payer: Self-pay

## 2022-05-14 DIAGNOSIS — E119 Type 2 diabetes mellitus without complications: Secondary | ICD-10-CM

## 2022-05-14 DIAGNOSIS — I10 Essential (primary) hypertension: Secondary | ICD-10-CM

## 2022-05-14 MED ORDER — AMLODIPINE BESYLATE 10 MG PO TABS: ORAL_TABLET | ORAL | 0 refills | Status: DC

## 2022-05-14 MED ORDER — ROSUVASTATIN CALCIUM 10 MG PO TABS: 10.0000 mg | ORAL_TABLET | Freq: Every day | ORAL | 0 refills | Status: DC

## 2022-05-14 NOTE — Progress Notes (Signed)
Her LDL or bad cholesterol is not close to goal range at all. Is she taking the cholesterol medication, rosuvastatin daily or how often? We need to get this lower, closer to 70 to reduce her risk of heart disease. Otherwise, her labs look fine.  Make sure to eat a low fat, low cholesterol diet and get at least 150 minutes of vigorous physical activity each week.

## 2022-05-17 LAB — HM MAMMOGRAPHY

## 2022-05-19 ENCOUNTER — Encounter: Payer: Self-pay | Admitting: Family Medicine

## 2022-08-11 ENCOUNTER — Encounter: Payer: Self-pay | Admitting: Family Medicine

## 2022-08-11 ENCOUNTER — Ambulatory Visit: Payer: PRIVATE HEALTH INSURANCE | Admitting: Family Medicine

## 2022-08-11 VITALS — BP 134/80 | HR 91 | Temp 97.6°F | Ht 61.0 in | Wt 192.0 lb

## 2022-08-11 DIAGNOSIS — E785 Hyperlipidemia, unspecified: Secondary | ICD-10-CM

## 2022-08-11 DIAGNOSIS — E559 Vitamin D deficiency, unspecified: Secondary | ICD-10-CM

## 2022-08-11 DIAGNOSIS — E119 Type 2 diabetes mellitus without complications: Secondary | ICD-10-CM | POA: Diagnosis not present

## 2022-08-11 DIAGNOSIS — E1169 Type 2 diabetes mellitus with other specified complication: Secondary | ICD-10-CM

## 2022-08-11 DIAGNOSIS — I10 Essential (primary) hypertension: Secondary | ICD-10-CM

## 2022-08-11 MED ORDER — AMLODIPINE BESYLATE 10 MG PO TABS: ORAL_TABLET | ORAL | 1 refills | Status: DC

## 2022-08-11 MED ORDER — ROSUVASTATIN CALCIUM 10 MG PO TABS: 10.0000 mg | ORAL_TABLET | Freq: Every day | ORAL | 1 refills | Status: DC

## 2022-08-11 NOTE — Assessment & Plan Note (Signed)
Check vitamin D level. Continue vitamin D3 2,000 IUs daily for now, adjust prn

## 2022-08-11 NOTE — Assessment & Plan Note (Signed)
BP controlled. Continue amlodipine 10 mg daily. Check renal function.

## 2022-08-11 NOTE — Progress Notes (Signed)
Subjective:     Patient ID: Jocelyn Cole, female    DOB: 25-Sep-1960, 62 y.o.   MRN: [id number]  Chief Complaint  Patient presents with   Medical Management of Chronic Issues    F/u on BP and hyperlipidemia     HPI Patient is in today for follow up on HTN and HLD  She was out of statin prior to establishing care here last visit. She has been taking rosuvastatin 10 mg daily for the past 3 months. She is not fasting today.   HTN- taking amlodipine 10 mg daily.  BP at home has been "good".   Diabetic eye exam. [facility] [address].   Dr. Talmage Nap manages her diabetes.   Health Maintenance Due  Topic Date Due   Diabetic kidney evaluation - Urine ACR  Never done   DTaP/Tdap/Td (1 - Tdap) Never done   OPHTHALMOLOGY EXAM  02/18/2021   PAP SMEAR-Modifier  04/20/2021    Past Medical History:  Diagnosis Date   Anemia    takes Iron PO daily   Diabetes mellitus without complication    Hyperlipidemia    on rosuvastatin   Hypertension     Past Surgical History:  Procedure Laterality Date   ABDOMINAL HYSTERECTOMY N/A 10/22/2014   Procedure: HYSTERECTOMY ABDOMINAL, CYSTOSCOPY;  Surgeon: Dara Lords, MD;  Location: [facility];  Service: Gynecology;  Laterality: N/A;  request to follow 7:30am case.  Requested 2 hours OR time for case   APPENDECTOMY  AGE 63   COLONOSCOPY  03/02/2011   DR. Russella Dar   SALPINGOOPHORECTOMY Bilateral 10/22/2014   Procedure: SALPINGO OOPHORECTOMY;  Surgeon: Dara Lords, MD;  Location: [facility];  Service: Gynecology;  Laterality: Bilateral;    Family History  Problem Relation Age of Onset   Hypertension Mother    Diabetes Mother    Dementia Mother    Diabetes Father    Heart disease Father    Stomach cancer Father    Hypertension Sister    Heart failure Sister    Diabetes Sister    Hypertension Sister    Hypertension Sister    Hypertension Brother    Diabetes Brother    Heart failure Brother    Hypertension Brother    Hypertension  Brother    Colon cancer Neg Hx    Colon polyps Neg Hx    Esophageal cancer Neg Hx    Rectal cancer Neg Hx     Social History   Socioeconomic History   Marital status: Married    Spouse name: Not on file   Number of children: Not on file   Years of education: Not on file   Highest education level: Not on file  Occupational History   Not on file  Tobacco Use   Smoking status: Never   Smokeless tobacco: Never  Vaping Use   Vaping Use: Never used  Substance and Sexual Activity   Alcohol use: Yes    Alcohol/week: 0.0 standard drinks of alcohol    Comment: Rare   Drug use: No   Sexual activity: Yes    Partners: Male    Birth control/protection: Condom    Comment: 1st intercourse 62 yo-More than 5 partners  Other Topics Concern   Not on file  Social History Narrative   Not on file   Social Determinants of Health   Financial Resource Strain: Not on file  Food Insecurity: Not on file  Transportation Needs: Not on file  Physical Activity: Not on  file  Stress: Not on file  Social Connections: Not on file  Intimate Partner Violence: Not on file    Outpatient Medications Prior to Visit  Medication Sig Dispense Refill   Cholecalciferol (VITAMIN D) 2000 UNITS tablet Take 2,000 Units by mouth daily.     IRON PO Take by mouth.     Lancets (ONETOUCH DELICA PLUS LANCET33G) MISC CHECK ONCE DAILY     MOUNJARO 7.5 MG/0.5ML Pen Inject into the skin.     ONETOUCH VERIO test strip 1 each daily.     amLODipine (NORVASC) 10 MG tablet TAKE 1 TABLET(10 MG) BY MOUTH DAILY 90 tablet 0   rosuvastatin (CRESTOR) 10 MG tablet Take 1 tablet (10 mg total) by mouth daily. 90 tablet 0   No facility-administered medications prior to visit.    No Known Allergies  Review of Systems  Constitutional:  Negative for chills and fever.  Respiratory:  Negative for shortness of breath.   Cardiovascular:  Negative for chest pain, palpitations and leg swelling.  Gastrointestinal:  Negative for  abdominal pain, constipation, diarrhea, nausea and vomiting.  Genitourinary:  Negative for dysuria, frequency and urgency.  Neurological:  Negative for dizziness.       Objective:    Physical Exam Constitutional:      General: She is not in acute distress.    Appearance: She is not ill-appearing.  Eyes:     Extraocular Movements: Extraocular movements intact.     Conjunctiva/sclera: Conjunctivae normal.  Cardiovascular:     Rate and Rhythm: Normal rate.  Pulmonary:     Effort: Pulmonary effort is normal.  Musculoskeletal:     Cervical back: Normal range of motion and neck supple.  Skin:    General: Skin is warm and dry.  Neurological:     General: No focal deficit present.     Mental Status: She is alert and oriented to person, place, and time.  Psychiatric:        Mood and Affect: Mood normal.        Behavior: Behavior normal.        Thought Content: Thought content normal.     BP 134/80 (BP Location: Left Arm, Patient Position: Sitting, Cuff Size: Large)   Pulse 91   Temp 97.6 F (36.4 C) (Temporal)   Ht 5\' 1"  (1.549 m)   Wt 192 lb (87.1 kg)   LMP 02/10/2014   SpO2 99%   BMI 36.28 kg/m  Wt Readings from Last 3 Encounters:  08/11/22 192 lb (87.1 kg)  05/12/22 190 lb (86.2 kg)  07/27/21 187 lb (84.8 kg)       Assessment & Plan:   Problem List Items Addressed This Visit       Cardiovascular and Mediastinum   Essential hypertension    BP controlled. Continue amlodipine 10 mg daily. Check renal function.       Relevant Medications   rosuvastatin (CRESTOR) 10 MG tablet   amLODipine (NORVASC) 10 MG tablet   Other Relevant Orders   Comprehensive metabolic panel     Endocrine   Hyperlipidemia associated with type 2 diabetes mellitus - Primary    Return for fasting lab visit to check lipids. Continue Crestor 10 mg daily       Relevant Medications   rosuvastatin (CRESTOR) 10 MG tablet   amLODipine (NORVASC) 10 MG tablet   Other Relevant Orders   Lipid  panel   Type 2 diabetes mellitus without complication, without long-term current use of insulin  Managed by Dr. Talmage NapBalan      Relevant Medications   rosuvastatin (CRESTOR) 10 MG tablet     Other   Vitamin D deficiency    Check vitamin D level. Continue vitamin D3 2,000 IUs daily for now, adjust prn      Relevant Orders   VITAMIN D 25 Hydroxy (Vit-D Deficiency, Fractures)    I am having Jocelyn C. Samuel BoucheLucas "Sena" maintain her IRON PO, Vitamin D, OneTouch Verio, OneTouch Delica Plus Lancet33G, Mounjaro, rosuvastatin, and amLODipine.  Meds ordered this encounter  Medications   rosuvastatin (CRESTOR) 10 MG tablet    Sig: Take 1 tablet (10 mg total) by mouth daily.    Dispense:  90 tablet    Refill:  1   amLODipine (NORVASC) 10 MG tablet    Sig: TAKE 1 TABLET(10 MG) BY MOUTH DAILY    Dispense:  90 tablet    Refill:  1

## 2022-08-11 NOTE — Assessment & Plan Note (Signed)
Managed by Dr Balan 

## 2022-08-11 NOTE — Assessment & Plan Note (Signed)
Return for fasting lab visit to check lipids. Continue Crestor 10 mg daily

## 2022-08-11 NOTE — Patient Instructions (Signed)
Return next Monday for fasting labs.

## 2022-08-16 LAB — COMPREHENSIVE METABOLIC PANEL
ALT: 16 U/L (ref 0–35)
AST: 15 U/L (ref 0–37)
Albumin: 4.3 g/dL (ref 3.5–5.2)
Alkaline Phosphatase: 79 U/L (ref 39–117)
BUN: 11 mg/dL (ref 6–23)
CO2: 27 mEq/L (ref 19–32)
Calcium: 9.5 mg/dL (ref 8.4–10.5)
Chloride: 104 mEq/L (ref 96–112)
Creatinine, Ser: 0.75 mg/dL (ref 0.40–1.20)
GFR: 85.66 mL/min (ref >60.00)
Glucose, Bld: 96 mg/dL (ref 70–99)
Potassium: 3.9 mEq/L (ref 3.5–5.1)
Sodium: 138 mEq/L (ref 135–145)
Total Bilirubin: 0.5 mg/dL (ref 0.2–1.2)
Total Protein: 7.6 g/dL (ref 6.0–8.3)

## 2022-08-16 LAB — LIPID PANEL
Cholesterol: 132 mg/dL (ref 0–200)
HDL: 58.4 mg/dL (ref >39.00)
LDL Cholesterol: 64 mg/dL (ref 0–99)
NonHDL: 73.44
Total CHOL/HDL Ratio: 2
Triglycerides: 47 mg/dL (ref 0.0–149.0)
VLDL: 9.4 mg/dL (ref 0.0–40.0)

## 2022-08-16 LAB — VITAMIN D 25 HYDROXY (VIT D DEFICIENCY, FRACTURES): VITD: 60.2 ng/mL (ref 30.00–100.00)

## 2022-12-11 ENCOUNTER — Other Ambulatory Visit: Payer: Self-pay | Admitting: Family Medicine

## 2022-12-11 DIAGNOSIS — E1169 Type 2 diabetes mellitus with other specified complication: Secondary | ICD-10-CM

## 2023-01-07 ENCOUNTER — Encounter (INDEPENDENT_AMBULATORY_CARE_PROVIDER_SITE_OTHER): Payer: No Typology Code available for payment source | Admitting: Ophthalmology

## 2023-01-07 DIAGNOSIS — I10 Essential (primary) hypertension: Secondary | ICD-10-CM

## 2023-01-07 DIAGNOSIS — H43813 Vitreous degeneration, bilateral: Secondary | ICD-10-CM | POA: Diagnosis not present

## 2023-01-07 DIAGNOSIS — H35033 Hypertensive retinopathy, bilateral: Secondary | ICD-10-CM | POA: Diagnosis not present

## 2023-01-07 DIAGNOSIS — H31002 Unspecified chorioretinal scars, left eye: Secondary | ICD-10-CM | POA: Diagnosis not present

## 2023-02-28 ENCOUNTER — Other Ambulatory Visit: Payer: Self-pay | Admitting: Family Medicine

## 2023-02-28 DIAGNOSIS — I10 Essential (primary) hypertension: Secondary | ICD-10-CM

## 2023-04-25 ENCOUNTER — Encounter: Payer: Self-pay | Admitting: Family Medicine

## 2023-04-25 NOTE — Telephone Encounter (Signed)
 Care team updated and letter sent for eye exam notes.

## 2023-05-18 ENCOUNTER — Encounter: Payer: PRIVATE HEALTH INSURANCE | Admitting: Family Medicine

## 2023-05-23 LAB — HM MAMMOGRAPHY

## 2023-05-25 ENCOUNTER — Encounter: Payer: Self-pay | Admitting: Family Medicine

## 2023-05-30 LAB — LIPID PANEL
Cholesterol: 129 (ref 0–200)
HDL: 57 (ref 35–70)
LDL Cholesterol: 61
Triglycerides: 48 (ref 40–160)

## 2023-05-30 LAB — COMPREHENSIVE METABOLIC PANEL WITH GFR
Albumin: 4.2 (ref 3.5–5.0)
Calcium: 9.7 (ref 8.7–10.7)
Globulin: 2.7
eGFR: 99

## 2023-05-30 LAB — HEPATIC FUNCTION PANEL
ALT: 21 U/L (ref 7–35)
AST: 17 (ref 13–35)
Alkaline Phosphatase: 104 (ref 25–125)
Bilirubin, Total: 0.4

## 2023-05-30 LAB — BASIC METABOLIC PANEL WITH GFR
BUN: 7 (ref 4–21)
Chloride: 102 (ref 99–108)
Creatinine: 0.7 (ref 0.5–1.1)
Glucose: 105
Potassium: 4.1 meq/L (ref 3.5–5.1)
Sodium: 139 (ref 137–147)

## 2023-05-30 LAB — PROTEIN / CREATININE RATIO, URINE
Albumin, U: 5.5
Creatinine, Urine: 59.2

## 2023-05-30 LAB — TSH: TSH: 1.8 (ref 0.41–5.90)

## 2023-05-30 LAB — MICROALBUMIN / CREATININE URINE RATIO: Microalb Creat Ratio: 9

## 2023-05-30 LAB — HEMOGLOBIN A1C: Hemoglobin A1C: 7.5

## 2023-06-02 ENCOUNTER — Ambulatory Visit: Payer: PRIVATE HEALTH INSURANCE | Admitting: Family Medicine

## 2023-06-02 ENCOUNTER — Encounter: Payer: Self-pay | Admitting: Family Medicine

## 2023-06-02 VITALS — BP 134/86 | HR 98 | Temp 97.6°F | Ht 61.0 in | Wt 190.0 lb

## 2023-06-02 DIAGNOSIS — Z7185 Encounter for immunization safety counseling: Secondary | ICD-10-CM

## 2023-06-02 DIAGNOSIS — E1169 Type 2 diabetes mellitus with other specified complication: Secondary | ICD-10-CM | POA: Diagnosis not present

## 2023-06-02 DIAGNOSIS — E119 Type 2 diabetes mellitus without complications: Secondary | ICD-10-CM

## 2023-06-02 DIAGNOSIS — Z0001 Encounter for general adult medical examination with abnormal findings: Secondary | ICD-10-CM | POA: Diagnosis not present

## 2023-06-02 DIAGNOSIS — I10 Essential (primary) hypertension: Secondary | ICD-10-CM | POA: Diagnosis not present

## 2023-06-02 DIAGNOSIS — E785 Hyperlipidemia, unspecified: Secondary | ICD-10-CM

## 2023-06-02 NOTE — Patient Instructions (Signed)
Continue eating a healthy diet and getting plenty of exercise.  Keep up the good work!   I recommend getting your vaccines up-to-date. Tdap, pneumonia, and shingles vaccines.  You can get these here or at your pharmacy.  I would space them out and not get them all at once.  Please call and schedule with your gynecologist to get a pelvic exam.

## 2023-06-02 NOTE — Progress Notes (Signed)
Complete physical exam  Patient: Jocelyn Cole   DOB: 1960-11-02   63 y.o. Female  MRN: [id number]  Subjective:    Chief Complaint  Patient presents with   Annual Exam    Not fasting    She is here for a complete physical exam.  She sees Dr. Talmage Nap for diabetes.  She brought in recent fasting labs that she had done.  Her cholesterol is at goal range.  Normal renal function.  A1c 7.5%.  Denies any concerns.    Health Maintenance  Topic Date Due   Pneumococcal Vaccination (1 of 2 - PCV) Never done   Yearly kidney health urinalysis for diabetes  Never done   DTaP/Tdap/Td vaccine (1 - Tdap) Never done   Zoster (Shingles) Vaccine (1 of 2) Never done   Eye exam for diabetics  02/18/2021   Pap with HPV screening  04/21/2023   COVID-19 Vaccine (3 - 2024-25 season) 06/18/2023*   Flu Shot  08/01/2023*   Yearly kidney function blood test for diabetes  08/16/2023   Mammogram  05/22/2025   HIV Screening  Completed   Hepatitis C Screening  Addressed   HPV Vaccine  Aged Out   Complete foot exam   Discontinued   Hemoglobin A1C  Discontinued   Colon Cancer Screening  Discontinued  *Topic was postponed. The date shown is not the original due date.    Wears seatbelt always, smoke detectors in home and functioning, does not text while driving, feels safe in home environment.  Depression screening:    06/02/2023    3:51 PM 08/11/2022    4:11 PM 05/12/2022    4:21 PM  Depression screen PHQ 2/9  Decreased Interest 0 0 0  Down, Depressed, Hopeless 0 0 0  PHQ - 2 Score 0 0 0   Anxiety Screening:     No data to display          Vision:Within last year and Dental: No current dental problems and Receives regular dental care  Patient Active Problem List   Diagnosis Date Noted   Hyperlipidemia associated with type 2 diabetes mellitus (HCC) 08/11/2022   Vitamin D deficiency 08/11/2022   Type 2 diabetes mellitus without complication, without long-term current use of insulin (HCC)  04/30/2020   Essential hypertension 02/22/2019   Diabetes mellitus    Past Medical History:  Diagnosis Date   Anemia    takes Iron PO daily   Diabetes mellitus without complication (HCC)    Hyperlipidemia    on rosuvastatin   Hypertension    Past Surgical History:  Procedure Laterality Date   ABDOMINAL HYSTERECTOMY N/A 10/22/2014   Procedure: HYSTERECTOMY ABDOMINAL, CYSTOSCOPY;  Surgeon: Dara Lords, MD;  Location: [facility];  Service: Gynecology;  Laterality: N/A;  request to follow 7:30am case.  Requested 2 hours OR time for case   APPENDECTOMY  AGE 8   COLONOSCOPY  03/02/2011   DR. Russella Dar   SALPINGOOPHORECTOMY Bilateral 10/22/2014   Procedure: SALPINGO OOPHORECTOMY;  Surgeon: Dara Lords, MD;  Location: [facility];  Service: Gynecology;  Laterality: Bilateral;   Social History   Tobacco Use   Smoking status: Never   Smokeless tobacco: Never  Vaping Use   Vaping status: Never Used  Substance Use Topics   Alcohol use: Not Currently    Comment: Rare   Drug use: No      Patient Care Team: Avanell Shackleton, NP-C as PCP - General (Family Medicine) Dorisann Frames, MD as Consulting  Physician (Endocrinology) [facility], [place]   Outpatient Medications Prior to Visit  Medication Sig   amLODipine (NORVASC) 10 MG tablet TAKE 1 TABLET(10 MG) BY MOUTH DAILY   Cholecalciferol (VITAMIN D) 2000 UNITS tablet Take 2,000 Units by mouth daily.   IRON PO Take by mouth.   Lancets (ONETOUCH DELICA PLUS LANCET33G) MISC CHECK ONCE DAILY   MOUNJARO 7.5 MG/0.5ML Pen Inject into the skin.   ONETOUCH VERIO test strip 1 each daily.   rosuvastatin (CRESTOR) 10 MG tablet TAKE 1 TABLET(10 MG) BY MOUTH DAILY   No facility-administered medications prior to visit.    Review of Systems  Constitutional:  Negative for chills, fever and malaise/fatigue.  HENT:  Negative for congestion, ear pain, sinus pain and sore throat.   Eyes:  Negative for blurred vision,  double vision and pain.  Respiratory:  Negative for cough, shortness of breath and wheezing.   Cardiovascular:  Negative for chest pain, palpitations and leg swelling.  Gastrointestinal:  Negative for abdominal pain, constipation, diarrhea, nausea and vomiting.  Genitourinary:  Negative for dysuria, frequency and urgency.  Musculoskeletal:  Negative for back pain, joint pain and myalgias.  Skin:  Negative for rash.  Neurological:  Negative for dizziness, tingling, focal weakness and headaches.  Psychiatric/Behavioral:  Negative for depression and memory loss. The patient is not nervous/anxious.        Objective:    BP 134/86 (BP Location: Left Arm, Patient Position: Sitting, Cuff Size: Large)   Pulse 98   Temp 97.6 F (36.4 C) (Temporal)   Ht 5\' 1"  (1.549 m)   Wt 190 lb (86.2 kg)   LMP 02/10/2014   SpO2 97%   BMI 35.90 kg/m  BP Readings from Last 3 Encounters:  06/02/23 134/86  08/11/22 134/80  05/12/22 130/84   Wt Readings from Last 3 Encounters:  06/02/23 190 lb (86.2 kg)  08/11/22 192 lb (87.1 kg)  05/12/22 190 lb (86.2 kg)    Physical Exam Constitutional:      General: She is not in acute distress.    Appearance: She is not ill-appearing.  HENT:     Right Ear: Tympanic membrane, ear canal and external ear normal.     Left Ear: Tympanic membrane, ear canal and external ear normal.     Nose: Nose normal.     Mouth/Throat:     Mouth: Mucous membranes are moist.     Pharynx: Oropharynx is clear.  Eyes:     Extraocular Movements: Extraocular movements intact.     Conjunctiva/sclera: Conjunctivae normal.     Pupils: Pupils are equal, round, and reactive to light.  Neck:     Thyroid: No thyroid mass, thyromegaly or thyroid tenderness.  Cardiovascular:     Rate and Rhythm: Normal rate and regular rhythm.     Pulses: Normal pulses.     Heart sounds: Normal heart sounds.  Pulmonary:     Effort: Pulmonary effort is normal.     Breath sounds: Normal breath sounds.   Abdominal:     General: Bowel sounds are normal.     Palpations: Abdomen is soft.     Tenderness: There is no abdominal tenderness. There is no right CVA tenderness, left CVA tenderness, guarding or rebound.  Musculoskeletal:        General: Normal range of motion.     Cervical back: Normal range of motion and neck supple. No tenderness.     Right lower leg: No edema.  Left lower leg: No edema.  Lymphadenopathy:     Cervical: No cervical adenopathy.  Skin:    General: Skin is warm and dry.     Findings: No lesion or rash.  Neurological:     General: No focal deficit present.     Mental Status: She is alert and oriented to person, place, and time.     Cranial Nerves: No cranial nerve deficit.     Sensory: No sensory deficit.     Motor: No weakness.     Gait: Gait normal.  Psychiatric:        Mood and Affect: Mood normal.        Behavior: Behavior normal.        Thought Content: Thought content normal.      No results found for any visits on 06/02/23.    Assessment & Plan:    Routine Health Maintenance and Physical Exam  Problem List Items Addressed This Visit     Essential hypertension   Hyperlipidemia associated with type 2 diabetes mellitus (HCC)   Type 2 diabetes mellitus without complication, without long-term current use of insulin (HCC)   Other Visit Diagnoses       Encounter for general adult medical examination with abnormal findings    -  Primary     Immunization counseling          Preventive health care reviewed.  Counseling on healthy lifestyle including diet and exercise.  Recommend regular dental and eye exams.  Immunizations reviewed.  Discussed safety.  She declines flu, pneumonia, Tdap and shingles vaccines today.  Counseling done on the importance of these vaccines.  She may return for nurse visit or get these at her local pharmacy. Mammogram and colonoscopy up-to-date. Eye exam up-to-date. Urine microalbumin up-to-date with  endocrinologist. Reviewed labs from endocrinology including fasting lipids which were in normal range.  Normal renal function. Follow-up here in 6 months and continue blood pressure and cholesterol medication   Return in about 6 months (around 11/30/2023) for chronic health conditions.     Hetty Blend, NP-C

## 2023-06-19 ENCOUNTER — Other Ambulatory Visit: Payer: Self-pay | Admitting: Family Medicine

## 2023-06-19 DIAGNOSIS — E1169 Type 2 diabetes mellitus with other specified complication: Secondary | ICD-10-CM

## 2023-09-07 ENCOUNTER — Other Ambulatory Visit: Payer: Self-pay | Admitting: Family Medicine

## 2023-09-07 DIAGNOSIS — I10 Essential (primary) hypertension: Secondary | ICD-10-CM

## 2023-11-30 ENCOUNTER — Encounter: Payer: Self-pay | Admitting: Family Medicine

## 2023-11-30 ENCOUNTER — Ambulatory Visit (INDEPENDENT_AMBULATORY_CARE_PROVIDER_SITE_OTHER): Payer: PRIVATE HEALTH INSURANCE | Admitting: Family Medicine

## 2023-11-30 VITALS — BP 122/84 | HR 80 | Temp 97.6°F | Ht 61.0 in | Wt 192.0 lb

## 2023-11-30 DIAGNOSIS — Z23 Encounter for immunization: Secondary | ICD-10-CM | POA: Diagnosis not present

## 2023-11-30 DIAGNOSIS — E669 Obesity, unspecified: Secondary | ICD-10-CM

## 2023-11-30 DIAGNOSIS — E1169 Type 2 diabetes mellitus with other specified complication: Secondary | ICD-10-CM

## 2023-11-30 DIAGNOSIS — Z124 Encounter for screening for malignant neoplasm of cervix: Secondary | ICD-10-CM | POA: Diagnosis not present

## 2023-11-30 DIAGNOSIS — I10 Essential (primary) hypertension: Secondary | ICD-10-CM

## 2023-11-30 DIAGNOSIS — Z7985 Long-term (current) use of injectable non-insulin antidiabetic drugs: Secondary | ICD-10-CM

## 2023-11-30 DIAGNOSIS — E785 Hyperlipidemia, unspecified: Secondary | ICD-10-CM

## 2023-11-30 MED ORDER — ROSUVASTATIN CALCIUM 10 MG PO TABS: ORAL_TABLET | ORAL | 1 refills | Status: DC

## 2023-11-30 MED ORDER — AMLODIPINE BESYLATE 10 MG PO TABS
ORAL_TABLET | ORAL | 1 refills | Status: AC
Start: 2023-11-30 — End: ?

## 2023-11-30 NOTE — Progress Notes (Signed)
 Subjective:     Patient ID: Jocelyn Cole, female    DOB: 10/26/1960, 63 y.o.   MRN: [id number]  Chief Complaint  Patient presents with   Medical Management of Chronic Issues    6 month f/u    HPI  Discussed the use of AI scribe software for clinical note transcription with the patient, who gave verbal consent to proceed.  History of Present Illness Jocelyn Cole is a 63 year old female with type 2 diabetes, essential hypertension, and hyperlipidemia who presents for follow-up on chronic health conditions.  Glycemic control - Type 2 diabetes mellitus with recent hemoglobin A1c of 7.4% - A1c increased during a two-month period without Mounjaro due to insurance issues - Resumed Mounjaro in January 2025 and has been taking it weekly since then - Adheres to a low sugar, low carbohydrate diet, though finds it challenging during holidays  Blood pressure management - Essential hypertension managed with amlodipine  - Home blood pressure monitoring consistently less than 130/80 mmHg  Lipid management - Hyperlipidemia managed with rosuvastatin  - Recent lipid panel: total cholesterol 129 mg/dL, triglycerides 48 mg/dL, HDL 57 mg/dL, LDL 61 mg/dL - No missed doses of cholesterol medication  Nutritional supplementation - Takes over-the-counter vitamin D  due to age - On iron supplementation for over 20 years for previously low iron levels  Physical activity - Exercises regularly every morning at 4 AM  Constitutional and systemic symptoms - No fever, chills, night sweats, headaches, dizziness, or vision changes - No chest pain, palpitations, or shortness of breath - No abdominal pain, nausea, vomiting, diarrhea, or constipation - No urinary frequency, burning, or swelling       Health Maintenance Due  Topic Date Due   Diabetic kidney evaluation - Urine ACR  Never done   Pneumococcal Vaccine 49-49 Years old (1 of 2 - PCV) Never done   Zoster Vaccines- Shingrix (1 of  2) Never done   OPHTHALMOLOGY EXAM  02/18/2021   Cervical Cancer Screening (HPV/Pap Cotest)  04/21/2023   Diabetic kidney evaluation - eGFR measurement  08/16/2023    Past Medical History:  Diagnosis Date   Anemia    takes Iron PO daily   Diabetes mellitus without complication (HCC)    Hyperlipidemia    on rosuvastatin    Hypertension     Past Surgical History:  Procedure Laterality Date   ABDOMINAL HYSTERECTOMY N/A 10/22/2014   Procedure: HYSTERECTOMY ABDOMINAL, CYSTOSCOPY;  Surgeon: Evalene SHAUNNA Organ, MD;  Location: [facility];  Service: Gynecology;  Laterality: N/A;  request to follow 7:30am case.  Requested 2 hours OR time for case   APPENDECTOMY  AGE 5   COLONOSCOPY  03/02/2011   DR. Aneita   SALPINGOOPHORECTOMY Bilateral 10/22/2014   Procedure: SALPINGO OOPHORECTOMY;  Surgeon: Evalene SHAUNNA Organ, MD;  Location: [facility];  Service: Gynecology;  Laterality: Bilateral;    Family History  Problem Relation Age of Onset   Hypertension Mother    Diabetes Mother    Dementia Mother    Diabetes Father    Heart disease Father    Stomach cancer Father    Hypertension Sister    Heart failure Sister    Diabetes Sister    Hypertension Sister    Hypertension Sister    Hypertension Brother    Diabetes Brother    Heart failure Brother    Hypertension Brother    Hypertension Brother    Colon cancer Neg Hx    Colon polyps Neg Hx  Esophageal cancer Neg Hx    Rectal cancer Neg Hx     Social History   Socioeconomic History   Marital status: Married    Spouse name: Not on file   Number of children: Not on file   Years of education: Not on file   Highest education level: Bachelor's degree (e.g., BA, AB, BS)  Occupational History   Not on file  Tobacco Use   Smoking status: Never   Smokeless tobacco: Never  Vaping Use   Vaping status: Never Used  Substance and Sexual Activity   Alcohol use: Not Currently    Comment: Rare   Drug use: No   Sexual activity: Not Currently     Partners: Male    Birth control/protection: Abstinence    Comment: 1st intercourse 63 yo-More than 5 partners  Other Topics Concern   Not on file  Social History Narrative   Not on file   Social Drivers of Health   Financial Resource Strain: Low Risk  (11/23/2023)   Overall Financial Resource Strain (CARDIA)    Difficulty of Paying Living Expenses: Not hard at all  Food Insecurity: No Food Insecurity (11/23/2023)   Hunger Vital Sign    Worried About Running Out of Food in the Last Year: Never true    Ran Out of Food in the Last Year: Never true  Transportation Needs: No Transportation Needs (11/23/2023)   PRAPARE - Administrator, Civil Service (Medical): No    Lack of Transportation (Non-Medical): No  Physical Activity: Sufficiently Active (11/23/2023)   Exercise Vital Sign    Days of Exercise per Week: 7 days    Minutes of Exercise per Session: 30 min  Stress: No Stress Concern Present (11/23/2023)   [employer] of Occupational Health - Occupational Stress Questionnaire    Feeling of Stress: Not at all  Social Connections: Moderately Isolated (11/23/2023)   Social Connection and Isolation Panel    Frequency of Communication with Friends and Family: More than three times a week    Frequency of Social Gatherings with Friends and Family: Once a week    Attends Religious Services: 1 to 4 times per year    Active Member of [employer] or Organizations: No    Attends Engineer, structural: Not on file    Marital Status: Separated  Intimate Partner Violence: Not on file    Outpatient Medications Prior to Visit  Medication Sig Dispense Refill   Cholecalciferol (VITAMIN D ) 2000 UNITS tablet Take 2,000 Units by mouth daily.     IRON PO Take by mouth.     Lancets (ONETOUCH DELICA PLUS LANCET33G) MISC CHECK ONCE DAILY     MOUNJARO 7.5 MG/0.5ML Pen Inject into the skin.     ONETOUCH VERIO test strip 1 each daily.     amLODipine  (NORVASC ) 10 MG tablet TAKE 1 TABLET(10  MG) BY MOUTH DAILY 90 tablet 1   rosuvastatin  (CRESTOR ) 10 MG tablet TAKE 1 TABLET(10 MG) BY MOUTH DAILY 90 tablet 1   No facility-administered medications prior to visit.    No Known Allergies  Review of Systems  Constitutional:  Negative for chills, fever and malaise/fatigue.  Eyes:  Negative for blurred vision, double vision and photophobia.  Respiratory:  Negative for shortness of breath.   Cardiovascular:  Negative for chest pain, palpitations and leg swelling.  Gastrointestinal:  Negative for abdominal pain, constipation, diarrhea, nausea and vomiting.  Genitourinary:  Negative for dysuria, frequency and urgency.  Neurological:  Negative  for dizziness, tingling, focal weakness and headaches.  Psychiatric/Behavioral:  Negative for depression. The patient is not nervous/anxious.        Objective:    Physical Exam Constitutional:      General: She is not in acute distress.    Appearance: She is not ill-appearing.  Eyes:     Extraocular Movements: Extraocular movements intact.     Conjunctiva/sclera: Conjunctivae normal.  Cardiovascular:     Rate and Rhythm: Normal rate and regular rhythm.  Pulmonary:     Effort: Pulmonary effort is normal.     Breath sounds: Normal breath sounds.  Musculoskeletal:     Cervical back: Normal range of motion and neck supple.     Right lower leg: No edema.     Left lower leg: No edema.  Skin:    General: Skin is warm and dry.  Neurological:     General: No focal deficit present.     Mental Status: She is alert and oriented to person, place, and time.  Psychiatric:        Mood and Affect: Mood normal.        Behavior: Behavior normal.        Thought Content: Thought content normal.      BP 122/84 (BP Location: Left Arm, Patient Position: Sitting)   Pulse 80   Temp 97.6 F (36.4 C) (Temporal)   Ht 5' 1 (1.549 m)   Wt 192 lb (87.1 kg)   LMP 02/10/2014   SpO2 95%   BMI 36.28 kg/m  Wt Readings from Last 3 Encounters:  11/30/23  192 lb (87.1 kg)  06/02/23 190 lb (86.2 kg)  08/11/22 192 lb (87.1 kg)       Assessment & Plan:   Problem List Items Addressed This Visit     Essential hypertension   Relevant Medications   rosuvastatin  (CRESTOR ) 10 MG tablet   amLODipine  (NORVASC ) 10 MG tablet   Hyperlipidemia associated with type 2 diabetes mellitus (HCC)   Relevant Medications   rosuvastatin  (CRESTOR ) 10 MG tablet   amLODipine  (NORVASC ) 10 MG tablet   Other Visit Diagnoses       Screening for cervical cancer    -  Primary   Relevant Orders   Ambulatory referral to Gynecology     Obesity (BMI 30-39.9)         Immunization due       Relevant Orders   Tdap vaccine greater than or equal to 7yo IM (Completed)      Assessment and Plan Assessment & Plan Type 2 diabetes mellitus with associated mixed hyperlipidemia A1c increased to 7.4 due to lapse in Mounjaro coverage, resumed in January. Lipid panel excellent: total cholesterol 129, triglycerides 48, HDL 57, LDL 61. - Continue Mounjaro 7.5 MG/0.5ML subcutaneous weekly. - Refill rosuvastatin  10 MG oral daily. - Request lab results from Doctor Balan's office after her appointment in two weeks.  Essential hypertension Well-controlled with amlodipine . Home readings <130/80 mmHg. - Continue amlodipine  10 MG oral daily.  Referral to [facility] gynecology for Pap smear since she is overdue.  Tdap counseling done and vaccine updated.    Follow up for fasting CPE when due.   I am having Harveen C. Duwaine Cherry maintain her IRON PO, Vitamin D , OneTouch Verio, OneTouch Delica Plus Lancet33G, Mounjaro, rosuvastatin , and amLODipine .  Meds ordered this encounter  Medications   rosuvastatin  (CRESTOR ) 10 MG tablet    Sig: TAKE 1 TABLET(10 MG) BY MOUTH DAILY    Dispense:  90 tablet    Refill:  1   amLODipine  (NORVASC ) 10 MG tablet    Sig: TAKE 1 TABLET(10 MG) BY MOUTH DAILY    Dispense:  90 tablet    Refill:  1

## 2023-11-30 NOTE — Patient Instructions (Addendum)
   Continue current medications and taking good care of your self.  You should hear from [facility] gynecology to schedule an appointment to update your Pap smear.  Be sure to get your yearly eye exams.  Records indicate that you are due for a pneumonia vaccine.  Today you received the Tdap vaccine.  This is good for 10 years.  Will see you back in early February for your next visit.  This will be your fasting preventive healthcare/physical exam.                                               Contains text generated by [employer].

## 2023-12-06 ENCOUNTER — Encounter: Payer: Self-pay | Admitting: Family Medicine

## 2024-06-01 ENCOUNTER — Encounter: Payer: PRIVATE HEALTH INSURANCE | Admitting: Family Medicine

## 2024-06-06 ENCOUNTER — Other Ambulatory Visit: Payer: Self-pay | Admitting: Family Medicine

## 2024-06-06 ENCOUNTER — Encounter: Payer: PRIVATE HEALTH INSURANCE | Admitting: Family Medicine

## 2024-06-06 DIAGNOSIS — E1169 Type 2 diabetes mellitus with other specified complication: Secondary | ICD-10-CM

## 2024-06-27 ENCOUNTER — Encounter: Payer: PRIVATE HEALTH INSURANCE | Admitting: Family Medicine
